# Patient Record
Sex: Male | Born: 1950
Health system: Southern US, Community
[De-identification: ages and names within clinical notes are randomized; demographics above are authoritative.]

## PROBLEM LIST (undated history)

## (undated) DIAGNOSIS — N1831 Chronic kidney disease, stage 3a: Secondary | ICD-10-CM

## (undated) DIAGNOSIS — D649 Anemia, unspecified: Secondary | ICD-10-CM

## (undated) DIAGNOSIS — G4730 Sleep apnea, unspecified: Secondary | ICD-10-CM

## (undated) DIAGNOSIS — J449 Chronic obstructive pulmonary disease, unspecified: Secondary | ICD-10-CM

## (undated) DIAGNOSIS — I6503 Occlusion and stenosis of bilateral vertebral arteries: Secondary | ICD-10-CM

## (undated) DIAGNOSIS — I6523 Occlusion and stenosis of bilateral carotid arteries: Secondary | ICD-10-CM

## (undated) DIAGNOSIS — I10 Essential (primary) hypertension: Secondary | ICD-10-CM

## (undated) DIAGNOSIS — I619 Nontraumatic intracerebral hemorrhage, unspecified: Secondary | ICD-10-CM

## (undated) DIAGNOSIS — I429 Cardiomyopathy, unspecified: Secondary | ICD-10-CM

## (undated) DIAGNOSIS — N200 Calculus of kidney: Secondary | ICD-10-CM

## (undated) DIAGNOSIS — Z87448 Personal history of other diseases of urinary system: Secondary | ICD-10-CM

## (undated) DIAGNOSIS — R5383 Other fatigue: Secondary | ICD-10-CM

## (undated) DIAGNOSIS — R5381 Other malaise: Secondary | ICD-10-CM

## (undated) DIAGNOSIS — E538 Deficiency of other specified B group vitamins: Secondary | ICD-10-CM

## (undated) DIAGNOSIS — Z8673 Personal history of transient ischemic attack (TIA), and cerebral infarction without residual deficits: Secondary | ICD-10-CM

## (undated) HISTORY — DX: Chronic kidney disease, stage 3a: N18.31

## (undated) HISTORY — DX: Chronic obstructive pulmonary disease, unspecified: J44.9

## (undated) HISTORY — DX: Nontraumatic intracerebral hemorrhage, unspecified: I61.9

## (undated) HISTORY — DX: Occlusion and stenosis of bilateral vertebral arteries: I65.03

## (undated) HISTORY — DX: Occlusion and stenosis of bilateral carotid arteries: I65.23

## (undated) HISTORY — DX: Cardiomyopathy, unspecified: I42.9

## (undated) HISTORY — DX: Other malaise: R53.81

## (undated) HISTORY — DX: Personal history of transient ischemic attack (TIA), and cerebral infarction without residual deficits: Z86.73

## (undated) HISTORY — DX: Essential (primary) hypertension: I10

## (undated) HISTORY — DX: Anemia, unspecified: D64.9

## (undated) HISTORY — DX: Personal history of other diseases of urinary system: Z87.448

## (undated) HISTORY — DX: Sleep apnea, unspecified: G47.30

## (undated) HISTORY — DX: Deficiency of other specified B group vitamins: E53.8

## (undated) HISTORY — DX: Other fatigue: R53.83

## (undated) HISTORY — DX: Calculus of kidney: N20.0

---

## 2016-03-30 DIAGNOSIS — F17210 Nicotine dependence, cigarettes, uncomplicated: Secondary | ICD-10-CM | POA: Diagnosis not present

## 2016-03-30 DIAGNOSIS — M79632 Pain in left forearm: Secondary | ICD-10-CM | POA: Diagnosis not present

## 2016-03-30 DIAGNOSIS — I10 Essential (primary) hypertension: Secondary | ICD-10-CM | POA: Diagnosis not present

## 2016-03-30 DIAGNOSIS — R0789 Other chest pain: Secondary | ICD-10-CM | POA: Diagnosis not present

## 2016-03-30 DIAGNOSIS — R079 Chest pain, unspecified: Secondary | ICD-10-CM | POA: Diagnosis not present

## 2016-03-31 DIAGNOSIS — M79632 Pain in left forearm: Secondary | ICD-10-CM | POA: Diagnosis not present

## 2016-05-13 DIAGNOSIS — I10 Essential (primary) hypertension: Secondary | ICD-10-CM | POA: Diagnosis not present

## 2016-05-13 DIAGNOSIS — M549 Dorsalgia, unspecified: Secondary | ICD-10-CM | POA: Diagnosis not present

## 2016-06-24 DIAGNOSIS — I10 Essential (primary) hypertension: Secondary | ICD-10-CM | POA: Diagnosis not present

## 2016-06-24 DIAGNOSIS — N2889 Other specified disorders of kidney and ureter: Secondary | ICD-10-CM | POA: Diagnosis not present

## 2016-07-11 DIAGNOSIS — N281 Cyst of kidney, acquired: Secondary | ICD-10-CM | POA: Diagnosis not present

## 2016-07-11 DIAGNOSIS — N2889 Other specified disorders of kidney and ureter: Secondary | ICD-10-CM | POA: Diagnosis not present

## 2016-12-22 DIAGNOSIS — M549 Dorsalgia, unspecified: Secondary | ICD-10-CM | POA: Diagnosis not present

## 2016-12-22 DIAGNOSIS — L723 Sebaceous cyst: Secondary | ICD-10-CM | POA: Diagnosis not present

## 2016-12-22 DIAGNOSIS — I10 Essential (primary) hypertension: Secondary | ICD-10-CM | POA: Diagnosis not present

## 2016-12-22 DIAGNOSIS — R21 Rash and other nonspecific skin eruption: Secondary | ICD-10-CM | POA: Diagnosis not present

## 2017-01-05 DIAGNOSIS — I10 Essential (primary) hypertension: Secondary | ICD-10-CM | POA: Diagnosis not present

## 2017-01-27 DIAGNOSIS — L720 Epidermal cyst: Secondary | ICD-10-CM | POA: Diagnosis not present

## 2017-02-04 DIAGNOSIS — I10 Essential (primary) hypertension: Secondary | ICD-10-CM | POA: Diagnosis not present

## 2017-02-25 DIAGNOSIS — I10 Essential (primary) hypertension: Secondary | ICD-10-CM | POA: Diagnosis not present

## 2017-02-25 DIAGNOSIS — R5383 Other fatigue: Secondary | ICD-10-CM | POA: Diagnosis not present

## 2017-02-25 DIAGNOSIS — Z79899 Other long term (current) drug therapy: Secondary | ICD-10-CM | POA: Diagnosis not present

## 2017-03-12 DIAGNOSIS — R079 Chest pain, unspecified: Secondary | ICD-10-CM | POA: Diagnosis not present

## 2017-03-12 DIAGNOSIS — I10 Essential (primary) hypertension: Secondary | ICD-10-CM | POA: Diagnosis not present

## 2017-03-19 DIAGNOSIS — I10 Essential (primary) hypertension: Secondary | ICD-10-CM | POA: Diagnosis not present

## 2017-03-19 DIAGNOSIS — Z79899 Other long term (current) drug therapy: Secondary | ICD-10-CM | POA: Diagnosis not present

## 2017-03-19 DIAGNOSIS — R5383 Other fatigue: Secondary | ICD-10-CM | POA: Diagnosis not present

## 2017-04-17 DIAGNOSIS — I10 Essential (primary) hypertension: Secondary | ICD-10-CM | POA: Diagnosis not present

## 2017-04-17 DIAGNOSIS — R001 Bradycardia, unspecified: Secondary | ICD-10-CM | POA: Diagnosis not present

## 2017-04-17 DIAGNOSIS — Z9114 Patient's other noncompliance with medication regimen: Secondary | ICD-10-CM | POA: Diagnosis not present

## 2017-05-01 DIAGNOSIS — I10 Essential (primary) hypertension: Secondary | ICD-10-CM | POA: Diagnosis not present

## 2017-07-01 DIAGNOSIS — F17210 Nicotine dependence, cigarettes, uncomplicated: Secondary | ICD-10-CM | POA: Diagnosis not present

## 2017-07-01 DIAGNOSIS — I10 Essential (primary) hypertension: Secondary | ICD-10-CM | POA: Diagnosis not present

## 2017-07-01 DIAGNOSIS — Z9114 Patient's other noncompliance with medication regimen: Secondary | ICD-10-CM | POA: Diagnosis not present

## 2017-07-15 DIAGNOSIS — Z79899 Other long term (current) drug therapy: Secondary | ICD-10-CM | POA: Diagnosis not present

## 2017-07-15 DIAGNOSIS — R062 Wheezing: Secondary | ICD-10-CM | POA: Diagnosis not present

## 2017-07-15 DIAGNOSIS — F17210 Nicotine dependence, cigarettes, uncomplicated: Secondary | ICD-10-CM | POA: Diagnosis not present

## 2017-07-15 DIAGNOSIS — Z9114 Patient's other noncompliance with medication regimen: Secondary | ICD-10-CM | POA: Diagnosis not present

## 2017-07-15 DIAGNOSIS — I10 Essential (primary) hypertension: Secondary | ICD-10-CM | POA: Diagnosis not present

## 2017-07-18 DIAGNOSIS — N201 Calculus of ureter: Secondary | ICD-10-CM | POA: Diagnosis not present

## 2017-07-18 DIAGNOSIS — R109 Unspecified abdominal pain: Secondary | ICD-10-CM | POA: Diagnosis not present

## 2017-07-18 DIAGNOSIS — Z87442 Personal history of urinary calculi: Secondary | ICD-10-CM | POA: Diagnosis not present

## 2017-07-20 DIAGNOSIS — N3001 Acute cystitis with hematuria: Secondary | ICD-10-CM | POA: Diagnosis not present

## 2017-07-20 DIAGNOSIS — N1330 Unspecified hydronephrosis: Secondary | ICD-10-CM | POA: Diagnosis not present

## 2017-07-20 DIAGNOSIS — R311 Benign essential microscopic hematuria: Secondary | ICD-10-CM | POA: Diagnosis not present

## 2017-07-20 DIAGNOSIS — N201 Calculus of ureter: Secondary | ICD-10-CM | POA: Diagnosis not present

## 2017-07-21 DIAGNOSIS — F17210 Nicotine dependence, cigarettes, uncomplicated: Secondary | ICD-10-CM | POA: Diagnosis not present

## 2017-07-21 DIAGNOSIS — N201 Calculus of ureter: Secondary | ICD-10-CM | POA: Diagnosis not present

## 2017-07-21 DIAGNOSIS — J449 Chronic obstructive pulmonary disease, unspecified: Secondary | ICD-10-CM | POA: Diagnosis not present

## 2017-07-21 DIAGNOSIS — N1330 Unspecified hydronephrosis: Secondary | ICD-10-CM | POA: Diagnosis not present

## 2017-07-21 DIAGNOSIS — R310 Gross hematuria: Secondary | ICD-10-CM | POA: Diagnosis not present

## 2017-07-21 DIAGNOSIS — I10 Essential (primary) hypertension: Secondary | ICD-10-CM | POA: Diagnosis not present

## 2017-07-22 DIAGNOSIS — N201 Calculus of ureter: Secondary | ICD-10-CM | POA: Diagnosis not present

## 2017-07-28 DIAGNOSIS — N201 Calculus of ureter: Secondary | ICD-10-CM | POA: Diagnosis not present

## 2017-07-28 DIAGNOSIS — N3020 Other chronic cystitis without hematuria: Secondary | ICD-10-CM | POA: Diagnosis not present

## 2017-08-12 DIAGNOSIS — Z9114 Patient's other noncompliance with medication regimen: Secondary | ICD-10-CM | POA: Diagnosis not present

## 2017-08-12 DIAGNOSIS — M549 Dorsalgia, unspecified: Secondary | ICD-10-CM | POA: Diagnosis not present

## 2017-08-12 DIAGNOSIS — I10 Essential (primary) hypertension: Secondary | ICD-10-CM | POA: Diagnosis not present

## 2017-08-12 DIAGNOSIS — F17210 Nicotine dependence, cigarettes, uncomplicated: Secondary | ICD-10-CM | POA: Diagnosis not present

## 2017-10-28 DIAGNOSIS — L300 Nummular dermatitis: Secondary | ICD-10-CM | POA: Diagnosis not present

## 2017-10-28 DIAGNOSIS — L299 Pruritus, unspecified: Secondary | ICD-10-CM | POA: Diagnosis not present

## 2017-10-28 DIAGNOSIS — L986 Other infiltrative disorders of the skin and subcutaneous tissue: Secondary | ICD-10-CM | POA: Diagnosis not present

## 2017-10-28 DIAGNOSIS — D485 Neoplasm of uncertain behavior of skin: Secondary | ICD-10-CM | POA: Diagnosis not present

## 2017-11-11 DIAGNOSIS — L986 Other infiltrative disorders of the skin and subcutaneous tissue: Secondary | ICD-10-CM | POA: Diagnosis not present

## 2017-11-13 DIAGNOSIS — F17210 Nicotine dependence, cigarettes, uncomplicated: Secondary | ICD-10-CM | POA: Diagnosis not present

## 2017-11-13 DIAGNOSIS — I10 Essential (primary) hypertension: Secondary | ICD-10-CM | POA: Diagnosis not present

## 2017-11-13 DIAGNOSIS — Z79899 Other long term (current) drug therapy: Secondary | ICD-10-CM | POA: Diagnosis not present

## 2017-11-23 ENCOUNTER — Ambulatory Visit (INDEPENDENT_AMBULATORY_CARE_PROVIDER_SITE_OTHER): Payer: Medicare HMO

## 2017-11-23 ENCOUNTER — Encounter: Payer: Self-pay | Admitting: Podiatry

## 2017-11-23 ENCOUNTER — Ambulatory Visit: Payer: Medicare HMO | Admitting: Podiatry

## 2017-11-23 VITALS — BP 143/67 | HR 61 | Ht 65.0 in | Wt 230.0 lb

## 2017-11-23 DIAGNOSIS — L97511 Non-pressure chronic ulcer of other part of right foot limited to breakdown of skin: Secondary | ICD-10-CM

## 2017-11-23 DIAGNOSIS — M79674 Pain in right toe(s): Secondary | ICD-10-CM

## 2017-11-27 DIAGNOSIS — R7989 Other specified abnormal findings of blood chemistry: Secondary | ICD-10-CM | POA: Diagnosis not present

## 2017-11-27 DIAGNOSIS — D47Z9 Other specified neoplasms of uncertain behavior of lymphoid, hematopoietic and related tissue: Secondary | ICD-10-CM | POA: Diagnosis not present

## 2017-11-27 DIAGNOSIS — R590 Localized enlarged lymph nodes: Secondary | ICD-10-CM | POA: Diagnosis not present

## 2017-11-30 ENCOUNTER — Encounter: Payer: Self-pay | Admitting: Podiatry

## 2017-11-30 ENCOUNTER — Ambulatory Visit: Payer: Medicare HMO | Admitting: Podiatry

## 2017-11-30 DIAGNOSIS — L986 Other infiltrative disorders of the skin and subcutaneous tissue: Secondary | ICD-10-CM | POA: Diagnosis not present

## 2017-11-30 DIAGNOSIS — L97511 Non-pressure chronic ulcer of other part of right foot limited to breakdown of skin: Secondary | ICD-10-CM | POA: Diagnosis not present

## 2017-11-30 NOTE — Progress Notes (Signed)
  Subjective:  Patient ID: Patrick Matthews, male    DOB: 08/11/1951,  MRN: [id number]  Chief Complaint  Patient presents with  . Wound Check    fu on right 1st toe wound is looking better still feels like there is something in it  Had consultation with cancer center and they cleared him no cancer    67 y.o. male returns for wound care. Believes the wound to be proving.  Was seen at the cancer center and cleared without further treatment. Denies N/V/F/Ch.  Objective:  There were no vitals filed for this visit. General AA&O x3. Normal mood and affect.  Vascular Foot warm to touch.  Neurologic Sensation grossly diminished.  Dermatologic (Wound) Wound Location: R hallux Wound Measurement: 0.5x0.5x0.1 Wound Base: Mixed Granular/Fibrotic Peri-wound: Normal Exudate: None: wound tissue dry  Wound progress: Improved since last check.  Orthopedic: No pain to palpation either foot.   Assessment & Plan:  Patient was evaluated and treated and all questions answered.  Skin lesion right hallux -Prior records reviewed.  Concern for atypical lymphoproliferative infiltrate.  States that findings could be worrisome for a T-cell lymphoma however PCR is negative.  Patient was seen by the wound care center which it did not recommend further treatment.  Ulcer R Hallux secondary to skin biopsy -Debridement as below. -Dressed with iodosorb, DSD.  Procedure: Selective Debridement of Wound Rationale: Removal of devitalized tissue from the wound to promote healing.  Pre-Debridement Wound Measurements: 0.5 cm x 0.5 cm x 0.1 cm  Post-Debridement Wound Measurements: same as pre-debridement. Type of Debridement: Selective Tissue Removed: Devitalized soft-tissue Instrumentation: 3-0 mm dermal curette Dressing: Dry, sterile, compression dressing. Disposition: Patient tolerated procedure well. Patient to return in 2 weeks for follow-up.Marland Kitchen  Return in about 2 weeks (around 12/14/2017) for Wound check.

## 2017-12-07 NOTE — Progress Notes (Signed)
  Subjective:  Patient ID: Patrick Matthews, male    DOB: 05-10-1951,  MRN: [id number]  Chief Complaint  Patient presents with  . Toe Pain    right great toe has felt like there is something in it went to dermatology the did biopsy and thought it was cancer then said it was not still having pain and still feels like there is something in it     67 y.o. male presents with the above complaint.  States that he had a biopsy performed of his right great toe about a month ago.  States that he feels that there is a dermatologist said the biopsy came back negative however out of concern he was sent to the cancer center for evaluation.  Here to discuss whether he should continue to go the cancer center.  No past medical history on file. No past surgical history on file.  Current Outpatient Medications:  .  lisinopril (PRINIVIL,ZESTRIL) 40 MG tablet, Take 40 mg by mouth daily., Disp: , Rfl:   No Known Allergies Review of Systems Objective:   Vitals:   11/23/17 1328  BP: (!) 143/67  Pulse: 61   General AA&O x3. Normal mood and affect.  Vascular Dorsalis pedis and posterior tibial pulses  present 1+ bilaterally  Capillary refill normal to all digits. Pedal hair growth normal.  Neurologic Epicritic sensation grossly present.  Dermatologic (Wound) Wound Location: R hallux Wound Measurement: 0.6x0.6 x0.1 Wound Base: Mixed Granular/Fibrotic Peri-wound: Normal Exudate: None: wound tissue dry   Orthopedic: MMT 5/5 in dorsiflexion, plantarflexion, inversion, and eversion. Normal lower extremity joint ROM without pain or crepitus.   Radiographs: Taken and reviewed. No osseous erosions present. No acute fractures or dislocations.  Assessment & Plan:  Patient was evaluated and treated and all questions answered.  Right first toe ulceration secondary to biopsy -Ulceration dressed with medihoney and DSD.  Patient educated on dressing changes -Advised to get further records for discussion  of whether he needs further therapy   Return in about 1 week (around 11/30/2017) for Wound check.

## 2017-12-10 ENCOUNTER — Other Ambulatory Visit: Payer: Self-pay | Admitting: Podiatry

## 2017-12-10 DIAGNOSIS — L97511 Non-pressure chronic ulcer of other part of right foot limited to breakdown of skin: Secondary | ICD-10-CM

## 2017-12-10 DIAGNOSIS — M79674 Pain in right toe(s): Secondary | ICD-10-CM

## 2017-12-14 ENCOUNTER — Ambulatory Visit: Payer: Medicare HMO | Admitting: Podiatry

## 2018-02-10 DIAGNOSIS — E7800 Pure hypercholesterolemia, unspecified: Secondary | ICD-10-CM | POA: Diagnosis not present

## 2018-02-10 DIAGNOSIS — R062 Wheezing: Secondary | ICD-10-CM | POA: Diagnosis not present

## 2018-02-10 DIAGNOSIS — Z79899 Other long term (current) drug therapy: Secondary | ICD-10-CM | POA: Diagnosis not present

## 2018-02-10 DIAGNOSIS — R5383 Other fatigue: Secondary | ICD-10-CM | POA: Diagnosis not present

## 2018-02-10 DIAGNOSIS — I10 Essential (primary) hypertension: Secondary | ICD-10-CM | POA: Diagnosis not present

## 2018-05-12 DIAGNOSIS — Z1389 Encounter for screening for other disorder: Secondary | ICD-10-CM | POA: Diagnosis not present

## 2018-05-12 DIAGNOSIS — I10 Essential (primary) hypertension: Secondary | ICD-10-CM | POA: Diagnosis not present

## 2018-05-12 DIAGNOSIS — Z0001 Encounter for general adult medical examination with abnormal findings: Secondary | ICD-10-CM | POA: Diagnosis not present

## 2018-05-12 DIAGNOSIS — Z125 Encounter for screening for malignant neoplasm of prostate: Secondary | ICD-10-CM | POA: Diagnosis not present

## 2018-05-12 DIAGNOSIS — Z1211 Encounter for screening for malignant neoplasm of colon: Secondary | ICD-10-CM | POA: Diagnosis not present

## 2018-05-12 DIAGNOSIS — Z1331 Encounter for screening for depression: Secondary | ICD-10-CM | POA: Diagnosis not present

## 2018-05-12 DIAGNOSIS — R350 Frequency of micturition: Secondary | ICD-10-CM | POA: Diagnosis not present

## 2018-05-12 DIAGNOSIS — F17210 Nicotine dependence, cigarettes, uncomplicated: Secondary | ICD-10-CM | POA: Diagnosis not present

## 2018-05-12 DIAGNOSIS — R5383 Other fatigue: Secondary | ICD-10-CM | POA: Diagnosis not present

## 2018-06-29 DIAGNOSIS — R7989 Other specified abnormal findings of blood chemistry: Secondary | ICD-10-CM | POA: Diagnosis not present

## 2018-07-12 DIAGNOSIS — Z1212 Encounter for screening for malignant neoplasm of rectum: Secondary | ICD-10-CM | POA: Diagnosis not present

## 2018-07-12 DIAGNOSIS — Z1211 Encounter for screening for malignant neoplasm of colon: Secondary | ICD-10-CM | POA: Diagnosis not present

## 2018-11-11 DIAGNOSIS — Z6836 Body mass index (BMI) 36.0-36.9, adult: Secondary | ICD-10-CM | POA: Diagnosis not present

## 2018-11-11 DIAGNOSIS — E7800 Pure hypercholesterolemia, unspecified: Secondary | ICD-10-CM | POA: Diagnosis not present

## 2018-11-11 DIAGNOSIS — I10 Essential (primary) hypertension: Secondary | ICD-10-CM | POA: Diagnosis not present

## 2018-11-11 DIAGNOSIS — F17210 Nicotine dependence, cigarettes, uncomplicated: Secondary | ICD-10-CM | POA: Diagnosis not present

## 2018-11-11 DIAGNOSIS — E669 Obesity, unspecified: Secondary | ICD-10-CM | POA: Diagnosis not present

## 2018-11-11 DIAGNOSIS — Z79899 Other long term (current) drug therapy: Secondary | ICD-10-CM | POA: Diagnosis not present

## 2018-11-11 DIAGNOSIS — M549 Dorsalgia, unspecified: Secondary | ICD-10-CM | POA: Diagnosis not present

## 2018-11-11 DIAGNOSIS — R7303 Prediabetes: Secondary | ICD-10-CM | POA: Diagnosis not present

## 2019-02-14 DIAGNOSIS — R29898 Other symptoms and signs involving the musculoskeletal system: Secondary | ICD-10-CM | POA: Diagnosis not present

## 2019-02-14 DIAGNOSIS — F17210 Nicotine dependence, cigarettes, uncomplicated: Secondary | ICD-10-CM | POA: Diagnosis not present

## 2019-02-14 DIAGNOSIS — E669 Obesity, unspecified: Secondary | ICD-10-CM | POA: Diagnosis not present

## 2019-02-14 DIAGNOSIS — R079 Chest pain, unspecified: Secondary | ICD-10-CM | POA: Diagnosis not present

## 2019-02-14 DIAGNOSIS — R42 Dizziness and giddiness: Secondary | ICD-10-CM | POA: Diagnosis not present

## 2019-02-14 DIAGNOSIS — Z6836 Body mass index (BMI) 36.0-36.9, adult: Secondary | ICD-10-CM | POA: Diagnosis not present

## 2019-02-15 DIAGNOSIS — R200 Anesthesia of skin: Secondary | ICD-10-CM | POA: Diagnosis not present

## 2019-02-15 DIAGNOSIS — R531 Weakness: Secondary | ICD-10-CM | POA: Diagnosis not present

## 2019-02-15 DIAGNOSIS — F17210 Nicotine dependence, cigarettes, uncomplicated: Secondary | ICD-10-CM | POA: Diagnosis not present

## 2019-02-15 DIAGNOSIS — R29898 Other symptoms and signs involving the musculoskeletal system: Secondary | ICD-10-CM | POA: Diagnosis not present

## 2019-02-15 DIAGNOSIS — R42 Dizziness and giddiness: Secondary | ICD-10-CM | POA: Diagnosis not present

## 2019-02-22 DIAGNOSIS — F17210 Nicotine dependence, cigarettes, uncomplicated: Secondary | ICD-10-CM | POA: Diagnosis not present

## 2019-02-22 DIAGNOSIS — I6523 Occlusion and stenosis of bilateral carotid arteries: Secondary | ICD-10-CM | POA: Diagnosis not present

## 2019-02-22 DIAGNOSIS — R42 Dizziness and giddiness: Secondary | ICD-10-CM | POA: Diagnosis not present

## 2019-02-22 DIAGNOSIS — R29898 Other symptoms and signs involving the musculoskeletal system: Secondary | ICD-10-CM | POA: Diagnosis not present

## 2019-02-25 DIAGNOSIS — I517 Cardiomegaly: Secondary | ICD-10-CM | POA: Diagnosis not present

## 2019-02-25 DIAGNOSIS — F17210 Nicotine dependence, cigarettes, uncomplicated: Secondary | ICD-10-CM | POA: Diagnosis not present

## 2019-02-25 DIAGNOSIS — J449 Chronic obstructive pulmonary disease, unspecified: Secondary | ICD-10-CM | POA: Diagnosis not present

## 2019-02-25 DIAGNOSIS — I639 Cerebral infarction, unspecified: Secondary | ICD-10-CM | POA: Diagnosis not present

## 2019-02-25 DIAGNOSIS — I252 Old myocardial infarction: Secondary | ICD-10-CM | POA: Diagnosis not present

## 2019-02-25 DIAGNOSIS — I6389 Other cerebral infarction: Secondary | ICD-10-CM | POA: Diagnosis not present

## 2019-02-25 DIAGNOSIS — Z792 Long term (current) use of antibiotics: Secondary | ICD-10-CM | POA: Diagnosis not present

## 2019-02-25 DIAGNOSIS — R079 Chest pain, unspecified: Secondary | ICD-10-CM | POA: Diagnosis not present

## 2019-02-25 DIAGNOSIS — I10 Essential (primary) hypertension: Secondary | ICD-10-CM | POA: Diagnosis not present

## 2019-03-01 ENCOUNTER — Ambulatory Visit (INDEPENDENT_AMBULATORY_CARE_PROVIDER_SITE_OTHER): Payer: Medicare HMO | Admitting: Neurology

## 2019-03-01 ENCOUNTER — Other Ambulatory Visit: Payer: Self-pay

## 2019-03-01 ENCOUNTER — Encounter: Payer: Self-pay | Admitting: Neurology

## 2019-03-01 DIAGNOSIS — I639 Cerebral infarction, unspecified: Secondary | ICD-10-CM | POA: Diagnosis not present

## 2019-03-01 DIAGNOSIS — I6381 Other cerebral infarction due to occlusion or stenosis of small artery: Secondary | ICD-10-CM

## 2019-03-01 NOTE — Progress Notes (Signed)
Virtual Visit via Video Note  I connected with Burnett Kanaris on 03/01/19 at  1:00 PM EDT by a video enabled telemedicine application and verified that I am speaking with the correct person using two identifiers.  This visit was performed using [email or web address] app for video and audio. The patient`s daughter was present throughout the visit and facilitated it location: Patient: at home with daughter Provider: at [facility] office Ref MD : Ernestene Kiel Reason for referral stroke   I discussed the limitations of evaluation and management by telemedicine and the availability of in person appointments. The patient expressed understanding and agreed to proceed.  History of Present Illness: Ms. Patrick Matthews is a pleasant 68 year old male seen today for virtual video consultation visit for stroke.  History is obtained from the patient and his daughter and I have personally reviewed imaging films in PACS.  He states he developed sudden onset of left hand weakness about a month ago.  This weakness lasted for a week he seems to have made near complete recovery.  He still has diminished fine motor skills in the left hand.  He denies any tingling numbness neck pain or radicular pain.  He denied slurred speech, headache, gait or balance problems.  He had a CT scan of the head performed at [facility] on 02/15/2019 which I have personally reviewed shows low density in the right frontoparietal cortex, right superior cerebellar cortex and left medial occipital lobes compatible with age indeterminate age infarcts.  Carotid ultrasound on 02/22/2019: Bilateral plaques but no significant stenosis at either carotid bifurcation.  He denies any history of atrial fibrillation or significant cardiac problems or prior strokes.  The patient does note an daytime sleepiness may be at risk for sleep apnea but has never been tested for it. Past medical history includes hypertension hyperlipidemia smoking and mild obesity.  Kidney stones  Family history mother diagnosed with stroke and father died of MI social history lives alone he smokes. Medication list aspirin 81 mg daily, lisinopril 40 mg daily, Lipitor 20 mg daily, ibuprofen 800 mg 3 times daily as needed.  Plavix 75 mg daily. 14 system review of systems is positive only for symptoms as stated above in history of present illness Observations/Objective: Physical neurological exam limited due to constraints of video visit.  Pleasant middle-aged mildly obese Caucasian male not in distress.  He is awake alert oriented to time place and person.  Speech and language appear normal.  Extraocular movements are full range without nystagmus.  Face appears symmetric with only subtle left nasolabial fold asymmetry when he smiles.  Tongue is midline.  Motor system exam no upper or lower extremity drift.  Slightly diminished fine finger movements on the left and 4 tapping on the left.  No focal weakness.  He walks with a fairly steady and slightly cautious gait.  He is able to stand on either foot unsupported and on his heels and toes. Assessment  : 68 year old Caucasian male with transient left leg weakness in April 2020 likely from embolic right MCA branch infarct.  CT scan also shows age-indeterminate right cerebellar and left occipital infarcts.  Etiology of stroke likely cardioembolic.  Vascular risk factors of hypertension, hyperlipidemia, smoking suspected sleep apnea and mild obesity.   Plan I had a long discussion with the patient and his daughter regarding his left leg weakness which likely is a result of thromboembolic right brain infarct  And the fact that he has 2 other silent strokes makes cardioembolic etiology quite likely.  I recommend further evaluation by checking echocardiogram and prolonged cardiac monitoring for paroxysmal A. fib.  Check lipid and A1c, MRI scan of the brain with MRA of the brain and neck.  Also polysomnogram for sleep apnea.  I have counseled the patient to  quit smoking and he agrees..  I also encouraged him to eat a healthy diet with lots of fruits, vegetables, cereals and whole grains and to be active and exercise regularly and lose weight.  Follow Up Instructions: Return for follow-up in 2 months  I discussed the assessment and treatment plan with the patient. The patient was provided an opportunity to ask questions and all were answered. The patient agreed with the plan and demonstrated an understanding of the instructions.   The patient was advised to call back or seek an in-person evaluation if the symptoms worsen or if the condition fails to improve as anticipated.  I provided 45 minutes of non-face-to-face time during this encounter.   Antony Contras, MD

## 2019-03-02 ENCOUNTER — Telehealth: Payer: Self-pay | Admitting: Neurology

## 2019-03-02 NOTE — Telephone Encounter (Signed)
Patrick Matthews: [id number] (exp. 03/02/19 to 05/31/19) order sent to GI. They will reach out to the patient to schedule.

## 2019-03-15 ENCOUNTER — Telehealth: Payer: Self-pay | Admitting: Neurology

## 2019-03-15 NOTE — Telephone Encounter (Signed)
Due to current COVID 19 pandemic, our office is severely reducing in office visits, in order to minimize the risk to our patients and healthcare providers.   Pt understands that although there may be some limitations with this type of visit, we will take all precautions to reduce any security or privacy concerns.  Pt understands that this will be treated like an in office visit and we will file with pt's insurance, and there may be a patient responsible charge related to this service.  Pt's email is [email or web address]. Pt will be using Doxy. Me for their virtual visit. Pt understands that the nurse will be calling to go over pt's chart. Pt was informed to measure neck size in inches prior to appointment with physician. Pt's daughter will be present to help the pt with virtual visit.

## 2019-03-21 NOTE — Telephone Encounter (Signed)
I called pt to update his chart prior to his appt. No answer, left a message asking him to call me back.

## 2019-03-21 NOTE — Telephone Encounter (Signed)
I called pt to update his chart. No answer, left a message asking him to call me back. 

## 2019-03-22 ENCOUNTER — Other Ambulatory Visit: Payer: Self-pay

## 2019-03-22 ENCOUNTER — Ambulatory Visit (INDEPENDENT_AMBULATORY_CARE_PROVIDER_SITE_OTHER): Payer: Medicare HMO | Admitting: Neurology

## 2019-03-22 ENCOUNTER — Encounter: Payer: Self-pay | Admitting: Neurology

## 2019-03-22 DIAGNOSIS — G4719 Other hypersomnia: Secondary | ICD-10-CM | POA: Diagnosis not present

## 2019-03-22 DIAGNOSIS — R0683 Snoring: Secondary | ICD-10-CM

## 2019-03-22 DIAGNOSIS — E669 Obesity, unspecified: Secondary | ICD-10-CM

## 2019-03-22 DIAGNOSIS — R351 Nocturia: Secondary | ICD-10-CM | POA: Diagnosis not present

## 2019-03-22 DIAGNOSIS — I6381 Other cerebral infarction due to occlusion or stenosis of small artery: Secondary | ICD-10-CM

## 2019-03-22 NOTE — Telephone Encounter (Signed)
I called pt.   Epworth Sleepiness Scale 0= would never doze 1= slight chance of dozing 2= moderate chance of dozing 3= high chance of dozing  Sitting and reading: 0 Watching TV: 1 Sitting inactive in a public place (ex. Theater or meeting): 0 As a passenger in a car for an hour without a break: 0 Lying down to rest in the afternoon: 1 Sitting and talking to someone: 0 Sitting quietly after lunch (no alcohol): 1 In a car, while stopped in traffic: 0 Total: 3  FSS: 54

## 2019-03-22 NOTE — Progress Notes (Signed)
Star Age, MD, PhD [facility] [address] [place], [place] [address]   Virtual Visit via Video Note on 03/22/2019:  I connected with Patrick Matthews on 03/22/19 at  4:00 PM EDT by a video enabled telemedicine application and verified that I am speaking with the correct person using two identifiers.   I discussed the limitations of evaluation and management by telemedicine and the availability of in person appointments. The patient expressed understanding and agreed to proceed.  History of Present Illness: Patrick Matthews is a 68 year old right-handed gentleman with an underlying medical history of hypertension, hyperlipidemia, lacunar stroke, smoking and obesity, who presents for a virtual, video based appointment via [email or web address] for evaluation of his sleep disorder, in particular, concern for underlying obstructive sleep apnea.  The patient is unaccompanied today and joins via cell phone from home, I am located in my office.  He is referred by Dr. Leonie Man and I reviewed his virtual visit note from 03/01/2019. He reports some weight gain, his self-reported weight about a month ago was 230 pounds.  His neck circumference by self-report is 18-1/2 inches.  His Epworth sleepiness score is 3 out of 24, fatigue severity score is 54 out of 63.  He is generally in bed between 10 and 11 PM, rise time is between 630 and 7.  He has significant nocturia about 3-4 times per average night.  He reports feeling improved since his lacunar stroke.  He never had a tonsillectomy.  He denies any family history of sleep apnea.  He admits to drinking caffeine all day long about 5 to 6 cups/day and does not drink water, very little by his estimation.  He has 2 dogs in the household, lives with his wife, daughter, granddaughter.  His Past Medical History Is Significant For: No past medical history on file.  His Past Surgical History Is Significant For: No past surgical history on  file.  His Family History Is Significant For: No family history on file.  His Social History Is Significant For: Social History   Socioeconomic History  . Marital status: Legally Separated    Spouse name: Not on file  . Number of children: Not on file  . Years of education: Not on file  . Highest education level: Not on file  Occupational History  . Not on file  Social Needs  . Financial resource strain: Not on file  . Food insecurity:    Worry: Not on file    Inability: Not on file  . Transportation needs:    Medical: Not on file    Non-medical: Not on file  Tobacco Use  . Smoking status: Current Every Day Smoker    Packs/day: 1.00  . Smokeless tobacco: Never Used  Substance and Sexual Activity  . Alcohol use: Not on file  . Drug use: Not on file  . Sexual activity: Not on file  Lifestyle  . Physical activity:    Days per week: Not on file    Minutes per session: Not on file  . Stress: Not on file  Relationships  . Social connections:    Talks on phone: Not on file    Gets together: Not on file    Attends religious service: Not on file    Active member of club or organization: Not on file    Attends meetings of clubs or organizations: Not on file    Relationship status: Not on file  Other Topics Concern  .  Not on file  Social History Narrative  . Not on file    His Allergies Are:  No Known Allergies:   His Current Medications Are:  Outpatient Encounter Medications as of 03/22/2019  Medication Sig  . lisinopril (PRINIVIL,ZESTRIL) 40 MG tablet Take 40 mg by mouth daily.   No facility-administered encounter medications on file as of 03/22/2019.   :   Review of Systems:  Out of a complete 14 point review of systems, all are reviewed and negative with the exception of these symptoms as listed below:  Observations/Objective: On examination, he is pleasant and conversant, in no acute distress.  Speech is edentulous sounding but not dysarthric.  Airway  examination reveals a moderately crowded airway secondary to thicker soft palate and thicker tongue.  He has edentulous state and the top with the exception of one tooth perhaps some several missing teeth in the lower jaw.  Hearing is grossly intact, face is symmetric with normal appearing facial animation. Upper body mobility and coordination are grossly intact.  Assessment and Plan: Patrick Matthews is a 68 year old right-handed gentleman with an underlying medical history of hypertension, hyperlipidemia, lacunar stroke, smoking and obesity, who presents for a virtual, video based appointment via [email or web address] for evaluation of his sleep disorder, in particular, concern for underlying obstructive sleep apnea. The patient's medical history and physical exam (albeit limited with current video-based evaluation) are concerning for a diagnosis of obstructive sleep apnea. I discussed with the patient the diagnosis of OSA, its prognosis and treatment options. I explained in particular the risks and ramifications of untreated moderate to severe OSA, especially with respect to developing cardiovascular disease and increase in stroke risk. I recommended the following at this time: sleep study. I explained the sleep test procedure to the patient and also outlined the CPAP treatment option. He indicated that he would be willing to try CPAP if the need arises. I answered all his questions today and the patient was in agreement. I plan to see the patient back after the sleep study is completed and encouraged him to call with any interim questions, concerns, problems or updates.   Star Age, MD, PhD    Follow Up Instructions:    I discussed the assessment and treatment plan with the patient. The patient was provided an opportunity to ask questions and all were answered. The patient agreed with the plan and demonstrated an understanding of the instructions.   The patient was advised to call back or seek an in-person  evaluation if the symptoms worsen or if the condition fails to improve as anticipated.  I provided 25 minutes of non-face-to-face time during this encounter.   Star Age, MD

## 2019-03-22 NOTE — Telephone Encounter (Signed)
I called pt again to update his chart prior to the virtual visit. No answer, left a message asking him to call us back.

## 2019-03-31 ENCOUNTER — Other Ambulatory Visit: Payer: Self-pay | Admitting: *Deleted

## 2019-03-31 DIAGNOSIS — I639 Cerebral infarction, unspecified: Secondary | ICD-10-CM

## 2019-04-10 DIAGNOSIS — I639 Cerebral infarction, unspecified: Secondary | ICD-10-CM | POA: Diagnosis not present

## 2019-04-12 ENCOUNTER — Other Ambulatory Visit (INDEPENDENT_AMBULATORY_CARE_PROVIDER_SITE_OTHER): Payer: Medicare HMO

## 2019-04-12 DIAGNOSIS — I639 Cerebral infarction, unspecified: Secondary | ICD-10-CM | POA: Diagnosis not present

## 2019-04-20 ENCOUNTER — Other Ambulatory Visit: Payer: Self-pay

## 2019-04-20 ENCOUNTER — Ambulatory Visit (INDEPENDENT_AMBULATORY_CARE_PROVIDER_SITE_OTHER): Payer: Medicare HMO | Admitting: Neurology

## 2019-04-20 DIAGNOSIS — R351 Nocturia: Secondary | ICD-10-CM

## 2019-04-20 DIAGNOSIS — I6381 Other cerebral infarction due to occlusion or stenosis of small artery: Secondary | ICD-10-CM

## 2019-04-20 DIAGNOSIS — E669 Obesity, unspecified: Secondary | ICD-10-CM

## 2019-04-20 DIAGNOSIS — G4719 Other hypersomnia: Secondary | ICD-10-CM

## 2019-04-20 DIAGNOSIS — R0683 Snoring: Secondary | ICD-10-CM

## 2019-04-20 DIAGNOSIS — G4733 Obstructive sleep apnea (adult) (pediatric): Secondary | ICD-10-CM | POA: Diagnosis not present

## 2019-04-27 ENCOUNTER — Telehealth: Payer: Self-pay

## 2019-04-27 NOTE — Telephone Encounter (Signed)
-----   Message from Star Age, MD sent at 04/27/2019  8:43 AM EDT ----- Patient referred by Dr. Leonie Man, seen by me on 03/22/19 in VV, HST on 04/20/19:  Please call and notify the patient that the recent home sleep test did suggest the diagnosis of severe obstructive sleep apnea and that I recommend treatment for this in the form of CPAP. I will request an overnight sleep study for proper titration and mask fitting. Please explain to patient and arrange for a CPAP titration study. I have placed an order in the chart. She sleep lab should call him soon.  Star Age, MD, PhD [facility])

## 2019-04-27 NOTE — Procedures (Signed)
Patient Information     First Name: Patrick Last Name: Matthews ID: [id number]  Birth Date: 1951-05-23 Age: 68 Gender: Male  Referring Provider: Ernestene Kiel, MD BMI: 38.2 (W=229 lb, H=5' 5'')  Neck Circ.:  18 '' Epworth:  3/24   Sleep Study Information    Study Date: Apr 20, 2019 S/H/A Version: 001.001.001.001 / 4.1.1528 / 72  History:      68 year old right-handed gentleman with an underlying medical history of hypertension, hyperlipidemia, lacunar stroke, smoking and obesity, who reports sleep sleep disruption from nocturia.   Summary & Diagnosis:     OSA   Recommendations:      This home sleep test demonstrates severe obstructive sleep apnea with a total AHI of 42.7/hour and O2 nadir of 82%. Treatment with positive airway pressure (PAP) - in the form of CPAP - is recommended. This will require, ideally, a full night CPAP titration study for proper treatment settings, O2 monitoring and mask fitting. Based on the severity of the sleep disordered breathing, an attended titration study is indicated. Please note, that untreated obstructive sleep apnea may carry additional perioperative morbidity. Patients with significant obstructive sleep apnea should receive perioperative PAP therapy and the surgeons and particularly the anesthesiologist should be informed of the diagnosis and the severity of the sleep disordered breathing. The patient should be cautioned not to drive, work at heights, or operate dangerous or heavy equipment when tired or sleepy. Review and reiteration of good sleep hygiene measures should be pursued with any patient. Other causes of the patient's symptoms, including circadian rhythm disturbances, an underlying mood disorder, medication effect and/or an underlying medical problem cannot be ruled out based on this test. Clinical correlation is recommended. The patient and his referring provider will be notified of the test results. The patient will be seen in follow up in  sleep clinic at [facility], either for a face-to-face or virtual visit, whichever feasible and recommended at the time.  I certify that I have reviewed the raw data recording prior to the issuance of this report in accordance with the standards of the American Academy of Sleep Medicine (AASM).  Star Age, MD, PhD [facility]) Diplomat, ABPN (Neurology and Sleep)             Sleep Summary  Oxygen Saturation Statistics   Start Study Time: End Study Time: Total Recording Time:  10:25:26 PM   6:42:48 AM   8 h, 17 min  Total Sleep Time % REM of Sleep Time:  6 h, 50 min  17.6    Mean: 92 Minimum: 82 Maximum: 97  Mean of Desaturations Nadirs (%):   90  Oxygen Desaturation. %:   4-9 10-20 >20 Total  Events Number Total    56  1 98.2 1.8  0 0.0  57 100.0  Oxygen Saturation: <90 <=88 <85 <80 <70  Duration (minutes): Sleep % 3.0 0.7  0.4 0.0  0.1 0.0 0.0 0.0 0.0 0.0     Respiratory Indices      Total Events REM NREM All Night  pRDI:  179  pAHI:  178 ODI:  57  pAHIc:  6  % CSR: 0.0 54.7 53.3 33.7 0.0 40.5 40.5 9.6 1.8 42.9 42.7 13.7 1.5       Pulse Rate Statistics during Sleep (BPM)      Mean: 56 Minimum: N/A Maximum:  84    Indices are calculated using technically valid sleep time of  4 hrs, 10 min.  Central-Indices are calculated using technically valid sleep time of  4  hrs, 3 min. pRDI/pAHI are calculated using oxi desaturations ? 3%  Body Position Statistics  Position Supine Prone Right Left Non-Supine  Sleep (min) 262.0 1.0 64.5 83.4 148.9  Sleep % 63.8 0.2 15.7 20.3 36.2  pRDI 42.2 N/A 55.2 39.1 44.9  pAHI 41.9 N/A 55.2 39.1 44.9  ODI 15.3 N/A 21.6 2.7 9.5     Snoring Statistics Snoring Level (dB) >40 >50 >60 >70 >80 >Threshold (45)  Sleep (min) 240.3 8.8 1.2 0.5 0.0 31.1  Sleep % 58.5 2.1 0.3 0.1 0.0 7.6    Mean: 42 dB Sleep Stages Chart                                                                 pAHI=42.7                                                                              Mild              Moderate                    Severe                                                 5              15                    30

## 2019-04-27 NOTE — Addendum Note (Signed)
Addended by: Star Age on: 04/27/2019 08:43 AM   Modules accepted: Orders

## 2019-04-27 NOTE — Progress Notes (Signed)
Patient referred by Dr. Leonie Man, seen by me on 03/22/19 in [place], [place] on 04/20/19:  Please call and notify the patient that the recent home sleep test did suggest the diagnosis of severe obstructive sleep apnea and that I recommend treatment for this in the form of CPAP. I will request an overnight sleep study for proper titration and mask fitting. Please explain to patient and arrange for a CPAP titration study. I have placed an order in the chart. She sleep lab should call him soon.  Star Age, MD, PhD [facility])

## 2019-04-27 NOTE — Telephone Encounter (Signed)
I called pt to discuss his sleep study results. No answer, left a message asking him to call me back. 

## 2019-04-28 NOTE — Telephone Encounter (Signed)
I called pt again to discuss his sleep study results. No answer, left a message asking her to call me back.

## 2019-05-02 NOTE — Telephone Encounter (Signed)
I called pt again to discuss. No answer, left a message asking him to call me back. This is my third unsuccessful attempt at reaching pt by phone. Will send him a letter asking him to call me back.

## 2019-05-12 DIAGNOSIS — Z6837 Body mass index (BMI) 37.0-37.9, adult: Secondary | ICD-10-CM | POA: Diagnosis not present

## 2019-05-12 DIAGNOSIS — I639 Cerebral infarction, unspecified: Secondary | ICD-10-CM | POA: Diagnosis not present

## 2019-05-12 DIAGNOSIS — I10 Essential (primary) hypertension: Secondary | ICD-10-CM | POA: Diagnosis not present

## 2019-05-12 DIAGNOSIS — E7800 Pure hypercholesterolemia, unspecified: Secondary | ICD-10-CM | POA: Diagnosis not present

## 2019-05-12 DIAGNOSIS — R062 Wheezing: Secondary | ICD-10-CM | POA: Diagnosis not present

## 2019-05-12 DIAGNOSIS — R350 Frequency of micturition: Secondary | ICD-10-CM | POA: Diagnosis not present

## 2019-05-12 DIAGNOSIS — F17210 Nicotine dependence, cigarettes, uncomplicated: Secondary | ICD-10-CM | POA: Diagnosis not present

## 2019-05-12 DIAGNOSIS — E669 Obesity, unspecified: Secondary | ICD-10-CM | POA: Diagnosis not present

## 2019-05-12 DIAGNOSIS — Z79899 Other long term (current) drug therapy: Secondary | ICD-10-CM | POA: Diagnosis not present

## 2019-06-09 DIAGNOSIS — R350 Frequency of micturition: Secondary | ICD-10-CM | POA: Diagnosis not present

## 2019-06-09 DIAGNOSIS — I10 Essential (primary) hypertension: Secondary | ICD-10-CM | POA: Diagnosis not present

## 2019-06-28 ENCOUNTER — Telehealth: Payer: Self-pay

## 2019-06-28 NOTE — Telephone Encounter (Signed)
We have attempted to call the patient an additional two more times to schedule sleep study.  Patient has been unavailable at the phone numbers we have on file and has not returned our calls.  If patient calls back we will schedule them for their sleep study.

## 2019-09-06 DIAGNOSIS — R519 Headache, unspecified: Secondary | ICD-10-CM | POA: Diagnosis not present

## 2019-09-06 DIAGNOSIS — I160 Hypertensive urgency: Secondary | ICD-10-CM | POA: Diagnosis not present

## 2019-09-06 DIAGNOSIS — M545 Low back pain: Secondary | ICD-10-CM | POA: Diagnosis not present

## 2019-09-06 DIAGNOSIS — R079 Chest pain, unspecified: Secondary | ICD-10-CM | POA: Diagnosis not present

## 2019-09-23 DIAGNOSIS — H25813 Combined forms of age-related cataract, bilateral: Secondary | ICD-10-CM | POA: Diagnosis not present

## 2019-09-27 DIAGNOSIS — Z79899 Other long term (current) drug therapy: Secondary | ICD-10-CM | POA: Diagnosis not present

## 2019-09-27 DIAGNOSIS — I10 Essential (primary) hypertension: Secondary | ICD-10-CM | POA: Diagnosis not present

## 2019-09-27 DIAGNOSIS — E7800 Pure hypercholesterolemia, unspecified: Secondary | ICD-10-CM | POA: Diagnosis not present

## 2019-10-13 DIAGNOSIS — I10 Essential (primary) hypertension: Secondary | ICD-10-CM | POA: Diagnosis not present

## 2019-10-17 DIAGNOSIS — M545 Low back pain: Secondary | ICD-10-CM | POA: Diagnosis not present

## 2019-12-19 DIAGNOSIS — H2511 Age-related nuclear cataract, right eye: Secondary | ICD-10-CM | POA: Diagnosis not present

## 2019-12-19 DIAGNOSIS — Z01818 Encounter for other preprocedural examination: Secondary | ICD-10-CM | POA: Diagnosis not present

## 2019-12-20 DIAGNOSIS — I10 Essential (primary) hypertension: Secondary | ICD-10-CM | POA: Diagnosis not present

## 2019-12-28 DIAGNOSIS — E7800 Pure hypercholesterolemia, unspecified: Secondary | ICD-10-CM | POA: Diagnosis not present

## 2019-12-28 DIAGNOSIS — Z79899 Other long term (current) drug therapy: Secondary | ICD-10-CM | POA: Diagnosis not present

## 2019-12-28 DIAGNOSIS — I10 Essential (primary) hypertension: Secondary | ICD-10-CM | POA: Diagnosis not present

## 2020-01-25 DIAGNOSIS — Z7189 Other specified counseling: Secondary | ICD-10-CM | POA: Diagnosis not present

## 2020-01-25 DIAGNOSIS — Z0001 Encounter for general adult medical examination with abnormal findings: Secondary | ICD-10-CM | POA: Diagnosis not present

## 2020-02-16 DIAGNOSIS — J449 Chronic obstructive pulmonary disease, unspecified: Secondary | ICD-10-CM | POA: Diagnosis not present

## 2020-02-16 DIAGNOSIS — I10 Essential (primary) hypertension: Secondary | ICD-10-CM | POA: Diagnosis not present

## 2020-02-16 DIAGNOSIS — E782 Mixed hyperlipidemia: Secondary | ICD-10-CM | POA: Diagnosis not present

## 2020-02-16 DIAGNOSIS — R0600 Dyspnea, unspecified: Secondary | ICD-10-CM | POA: Diagnosis not present

## 2020-02-16 DIAGNOSIS — Z8673 Personal history of transient ischemic attack (TIA), and cerebral infarction without residual deficits: Secondary | ICD-10-CM | POA: Diagnosis not present

## 2020-02-16 DIAGNOSIS — R5383 Other fatigue: Secondary | ICD-10-CM | POA: Diagnosis not present

## 2020-02-16 DIAGNOSIS — R5381 Other malaise: Secondary | ICD-10-CM | POA: Diagnosis not present

## 2020-02-16 DIAGNOSIS — M5136 Other intervertebral disc degeneration, lumbar region: Secondary | ICD-10-CM | POA: Diagnosis not present

## 2020-02-16 DIAGNOSIS — R0602 Shortness of breath: Secondary | ICD-10-CM | POA: Diagnosis not present

## 2020-02-16 DIAGNOSIS — Z0389 Encounter for observation for other suspected diseases and conditions ruled out: Secondary | ICD-10-CM | POA: Diagnosis not present

## 2020-03-08 DIAGNOSIS — J449 Chronic obstructive pulmonary disease, unspecified: Secondary | ICD-10-CM | POA: Diagnosis not present

## 2020-03-08 DIAGNOSIS — I10 Essential (primary) hypertension: Secondary | ICD-10-CM | POA: Diagnosis not present

## 2020-03-08 DIAGNOSIS — G4730 Sleep apnea, unspecified: Secondary | ICD-10-CM | POA: Diagnosis not present

## 2020-03-15 DIAGNOSIS — Z7902 Long term (current) use of antithrombotics/antiplatelets: Secondary | ICD-10-CM | POA: Diagnosis not present

## 2020-03-15 DIAGNOSIS — Z79899 Other long term (current) drug therapy: Secondary | ICD-10-CM | POA: Diagnosis not present

## 2020-03-15 DIAGNOSIS — J9811 Atelectasis: Secondary | ICD-10-CM | POA: Diagnosis not present

## 2020-03-15 DIAGNOSIS — I63233 Cerebral infarction due to unspecified occlusion or stenosis of bilateral carotid arteries: Secondary | ICD-10-CM | POA: Diagnosis not present

## 2020-03-15 DIAGNOSIS — J449 Chronic obstructive pulmonary disease, unspecified: Secondary | ICD-10-CM | POA: Diagnosis not present

## 2020-03-15 DIAGNOSIS — R519 Headache, unspecified: Secondary | ICD-10-CM | POA: Diagnosis not present

## 2020-03-15 DIAGNOSIS — R29702 NIHSS score 2: Secondary | ICD-10-CM | POA: Diagnosis not present

## 2020-03-15 DIAGNOSIS — I619 Nontraumatic intracerebral hemorrhage, unspecified: Secondary | ICD-10-CM | POA: Diagnosis not present

## 2020-03-15 DIAGNOSIS — Z8673 Personal history of transient ischemic attack (TIA), and cerebral infarction without residual deficits: Secondary | ICD-10-CM | POA: Diagnosis not present

## 2020-03-15 DIAGNOSIS — I63239 Cerebral infarction due to unspecified occlusion or stenosis of unspecified carotid arteries: Secondary | ICD-10-CM | POA: Diagnosis not present

## 2020-03-15 DIAGNOSIS — I10 Essential (primary) hypertension: Secondary | ICD-10-CM | POA: Diagnosis not present

## 2020-03-15 DIAGNOSIS — R42 Dizziness and giddiness: Secondary | ICD-10-CM | POA: Diagnosis not present

## 2020-03-15 DIAGNOSIS — I639 Cerebral infarction, unspecified: Secondary | ICD-10-CM | POA: Diagnosis not present

## 2020-03-17 DIAGNOSIS — I6503 Occlusion and stenosis of bilateral vertebral arteries: Secondary | ICD-10-CM | POA: Diagnosis not present

## 2020-03-17 DIAGNOSIS — E785 Hyperlipidemia, unspecified: Secondary | ICD-10-CM | POA: Diagnosis not present

## 2020-03-17 DIAGNOSIS — Z7902 Long term (current) use of antithrombotics/antiplatelets: Secondary | ICD-10-CM | POA: Diagnosis not present

## 2020-03-17 DIAGNOSIS — Z87442 Personal history of urinary calculi: Secondary | ICD-10-CM | POA: Diagnosis not present

## 2020-03-17 DIAGNOSIS — I672 Cerebral atherosclerosis: Secondary | ICD-10-CM | POA: Diagnosis not present

## 2020-03-17 DIAGNOSIS — Z7951 Long term (current) use of inhaled steroids: Secondary | ICD-10-CM | POA: Diagnosis not present

## 2020-03-17 DIAGNOSIS — I6389 Other cerebral infarction: Secondary | ICD-10-CM | POA: Diagnosis not present

## 2020-03-17 DIAGNOSIS — N189 Chronic kidney disease, unspecified: Secondary | ICD-10-CM | POA: Diagnosis not present

## 2020-03-17 DIAGNOSIS — I6502 Occlusion and stenosis of left vertebral artery: Secondary | ICD-10-CM | POA: Diagnosis not present

## 2020-03-17 DIAGNOSIS — I252 Old myocardial infarction: Secondary | ICD-10-CM | POA: Diagnosis not present

## 2020-03-17 DIAGNOSIS — I63211 Cerebral infarction due to unspecified occlusion or stenosis of right vertebral arteries: Secondary | ICD-10-CM | POA: Diagnosis not present

## 2020-03-17 DIAGNOSIS — I6501 Occlusion and stenosis of right vertebral artery: Secondary | ICD-10-CM | POA: Diagnosis not present

## 2020-03-17 DIAGNOSIS — Z743 Need for continuous supervision: Secondary | ICD-10-CM | POA: Diagnosis not present

## 2020-03-17 DIAGNOSIS — I69354 Hemiplegia and hemiparesis following cerebral infarction affecting left non-dominant side: Secondary | ICD-10-CM | POA: Diagnosis not present

## 2020-03-17 DIAGNOSIS — I10 Essential (primary) hypertension: Secondary | ICD-10-CM | POA: Diagnosis not present

## 2020-03-17 DIAGNOSIS — I129 Hypertensive chronic kidney disease with stage 1 through stage 4 chronic kidney disease, or unspecified chronic kidney disease: Secondary | ICD-10-CM | POA: Diagnosis not present

## 2020-03-17 DIAGNOSIS — I639 Cerebral infarction, unspecified: Secondary | ICD-10-CM | POA: Diagnosis not present

## 2020-03-17 DIAGNOSIS — R519 Headache, unspecified: Secondary | ICD-10-CM | POA: Diagnosis not present

## 2020-03-17 DIAGNOSIS — J449 Chronic obstructive pulmonary disease, unspecified: Secondary | ICD-10-CM | POA: Diagnosis not present

## 2020-03-17 DIAGNOSIS — E1122 Type 2 diabetes mellitus with diabetic chronic kidney disease: Secondary | ICD-10-CM | POA: Diagnosis not present

## 2020-03-17 DIAGNOSIS — R42 Dizziness and giddiness: Secondary | ICD-10-CM | POA: Diagnosis not present

## 2020-03-17 DIAGNOSIS — Z7982 Long term (current) use of aspirin: Secondary | ICD-10-CM | POA: Diagnosis not present

## 2020-03-17 DIAGNOSIS — I517 Cardiomegaly: Secondary | ICD-10-CM | POA: Diagnosis not present

## 2020-03-17 DIAGNOSIS — I6523 Occlusion and stenosis of bilateral carotid arteries: Secondary | ICD-10-CM | POA: Diagnosis not present

## 2020-03-17 DIAGNOSIS — I2510 Atherosclerotic heart disease of native coronary artery without angina pectoris: Secondary | ICD-10-CM | POA: Diagnosis not present

## 2020-03-17 DIAGNOSIS — Z79899 Other long term (current) drug therapy: Secondary | ICD-10-CM | POA: Diagnosis not present

## 2020-03-26 DIAGNOSIS — E782 Mixed hyperlipidemia: Secondary | ICD-10-CM | POA: Diagnosis not present

## 2020-03-26 DIAGNOSIS — I6503 Occlusion and stenosis of bilateral vertebral arteries: Secondary | ICD-10-CM | POA: Diagnosis not present

## 2020-03-26 DIAGNOSIS — I619 Nontraumatic intracerebral hemorrhage, unspecified: Secondary | ICD-10-CM | POA: Diagnosis not present

## 2020-03-26 DIAGNOSIS — I10 Essential (primary) hypertension: Secondary | ICD-10-CM | POA: Diagnosis not present

## 2020-03-26 DIAGNOSIS — I6523 Occlusion and stenosis of bilateral carotid arteries: Secondary | ICD-10-CM | POA: Diagnosis not present

## 2020-04-12 DIAGNOSIS — I679 Cerebrovascular disease, unspecified: Secondary | ICD-10-CM | POA: Diagnosis not present

## 2020-04-12 DIAGNOSIS — I6509 Occlusion and stenosis of unspecified vertebral artery: Secondary | ICD-10-CM | POA: Diagnosis not present

## 2020-04-26 DIAGNOSIS — G4733 Obstructive sleep apnea (adult) (pediatric): Secondary | ICD-10-CM | POA: Diagnosis not present

## 2020-04-26 DIAGNOSIS — J449 Chronic obstructive pulmonary disease, unspecified: Secondary | ICD-10-CM | POA: Diagnosis not present

## 2020-04-26 DIAGNOSIS — R5383 Other fatigue: Secondary | ICD-10-CM | POA: Diagnosis not present

## 2020-05-15 DIAGNOSIS — I6389 Other cerebral infarction: Secondary | ICD-10-CM | POA: Diagnosis not present

## 2020-05-20 DIAGNOSIS — Z711 Person with feared health complaint in whom no diagnosis is made: Secondary | ICD-10-CM | POA: Diagnosis not present

## 2020-05-20 DIAGNOSIS — G4733 Obstructive sleep apnea (adult) (pediatric): Secondary | ICD-10-CM | POA: Diagnosis not present

## 2020-05-20 DIAGNOSIS — R0683 Snoring: Secondary | ICD-10-CM | POA: Diagnosis not present

## 2020-05-24 DIAGNOSIS — R5383 Other fatigue: Secondary | ICD-10-CM | POA: Diagnosis not present

## 2020-05-24 DIAGNOSIS — G4733 Obstructive sleep apnea (adult) (pediatric): Secondary | ICD-10-CM | POA: Diagnosis not present

## 2020-05-24 DIAGNOSIS — J449 Chronic obstructive pulmonary disease, unspecified: Secondary | ICD-10-CM | POA: Diagnosis not present

## 2020-06-01 DIAGNOSIS — J449 Chronic obstructive pulmonary disease, unspecified: Secondary | ICD-10-CM | POA: Diagnosis not present

## 2020-06-07 DIAGNOSIS — I6389 Other cerebral infarction: Secondary | ICD-10-CM | POA: Diagnosis not present

## 2020-06-08 DIAGNOSIS — I493 Ventricular premature depolarization: Secondary | ICD-10-CM | POA: Diagnosis not present

## 2020-06-08 DIAGNOSIS — I471 Supraventricular tachycardia: Secondary | ICD-10-CM | POA: Diagnosis not present

## 2020-06-08 DIAGNOSIS — I491 Atrial premature depolarization: Secondary | ICD-10-CM | POA: Diagnosis not present

## 2020-06-11 DIAGNOSIS — E782 Mixed hyperlipidemia: Secondary | ICD-10-CM | POA: Diagnosis not present

## 2020-06-11 DIAGNOSIS — I10 Essential (primary) hypertension: Secondary | ICD-10-CM | POA: Diagnosis not present

## 2020-06-11 DIAGNOSIS — I619 Nontraumatic intracerebral hemorrhage, unspecified: Secondary | ICD-10-CM | POA: Diagnosis not present

## 2020-06-11 DIAGNOSIS — I6523 Occlusion and stenosis of bilateral carotid arteries: Secondary | ICD-10-CM | POA: Diagnosis not present

## 2020-06-11 DIAGNOSIS — E538 Deficiency of other specified B group vitamins: Secondary | ICD-10-CM | POA: Diagnosis not present

## 2020-06-11 DIAGNOSIS — I6503 Occlusion and stenosis of bilateral vertebral arteries: Secondary | ICD-10-CM | POA: Diagnosis not present

## 2020-07-02 DIAGNOSIS — J449 Chronic obstructive pulmonary disease, unspecified: Secondary | ICD-10-CM | POA: Diagnosis not present

## 2020-08-01 DIAGNOSIS — J449 Chronic obstructive pulmonary disease, unspecified: Secondary | ICD-10-CM | POA: Diagnosis not present

## 2020-09-01 DIAGNOSIS — J449 Chronic obstructive pulmonary disease, unspecified: Secondary | ICD-10-CM | POA: Diagnosis not present

## 2020-09-28 DIAGNOSIS — I669 Occlusion and stenosis of unspecified cerebral artery: Secondary | ICD-10-CM | POA: Diagnosis not present

## 2020-09-28 DIAGNOSIS — Z7982 Long term (current) use of aspirin: Secondary | ICD-10-CM | POA: Diagnosis not present

## 2020-09-28 DIAGNOSIS — I6389 Other cerebral infarction: Secondary | ICD-10-CM | POA: Diagnosis not present

## 2020-09-28 DIAGNOSIS — Z8673 Personal history of transient ischemic attack (TIA), and cerebral infarction without residual deficits: Secondary | ICD-10-CM | POA: Diagnosis not present

## 2020-09-28 DIAGNOSIS — Z79899 Other long term (current) drug therapy: Secondary | ICD-10-CM | POA: Diagnosis not present

## 2020-10-01 DIAGNOSIS — J449 Chronic obstructive pulmonary disease, unspecified: Secondary | ICD-10-CM | POA: Diagnosis not present

## 2020-10-08 DIAGNOSIS — R6889 Other general symptoms and signs: Secondary | ICD-10-CM | POA: Diagnosis not present

## 2020-10-08 DIAGNOSIS — N3000 Acute cystitis without hematuria: Secondary | ICD-10-CM | POA: Diagnosis not present

## 2020-10-08 DIAGNOSIS — Z20822 Contact with and (suspected) exposure to covid-19: Secondary | ICD-10-CM | POA: Diagnosis not present

## 2020-10-09 DIAGNOSIS — R509 Fever, unspecified: Secondary | ICD-10-CM | POA: Diagnosis not present

## 2020-10-09 DIAGNOSIS — R5383 Other fatigue: Secondary | ICD-10-CM | POA: Diagnosis not present

## 2020-10-09 DIAGNOSIS — U071 COVID-19: Secondary | ICD-10-CM | POA: Diagnosis not present

## 2020-10-09 DIAGNOSIS — M791 Myalgia, unspecified site: Secondary | ICD-10-CM | POA: Diagnosis not present

## 2020-10-09 DIAGNOSIS — Z20822 Contact with and (suspected) exposure to covid-19: Secondary | ICD-10-CM | POA: Diagnosis not present

## 2020-10-09 DIAGNOSIS — R6889 Other general symptoms and signs: Secondary | ICD-10-CM | POA: Diagnosis not present

## 2021-01-08 ENCOUNTER — Encounter: Payer: Self-pay | Admitting: Gastroenterology

## 2021-02-05 ENCOUNTER — Ambulatory Visit: Payer: Medicare HMO | Admitting: Gastroenterology

## 2021-02-06 ENCOUNTER — Ambulatory Visit (INDEPENDENT_AMBULATORY_CARE_PROVIDER_SITE_OTHER): Payer: Medicare HMO | Admitting: Gastroenterology

## 2021-02-06 ENCOUNTER — Encounter: Payer: Self-pay | Admitting: Gastroenterology

## 2021-02-06 ENCOUNTER — Ambulatory Visit: Payer: Medicare HMO | Admitting: Gastroenterology

## 2021-02-06 ENCOUNTER — Other Ambulatory Visit: Payer: Self-pay

## 2021-02-06 VITALS — BP 172/80 | HR 58 | Ht 66.0 in | Wt 238.1 lb

## 2021-02-06 DIAGNOSIS — D509 Iron deficiency anemia, unspecified: Secondary | ICD-10-CM | POA: Diagnosis not present

## 2021-02-06 DIAGNOSIS — R140 Abdominal distension (gaseous): Secondary | ICD-10-CM | POA: Diagnosis not present

## 2021-02-06 DIAGNOSIS — M7989 Other specified soft tissue disorders: Secondary | ICD-10-CM | POA: Diagnosis not present

## 2021-02-06 NOTE — Patient Instructions (Addendum)
If you are age 70 or older, your body mass index should be between 23-30. Your Body mass index is 38.43 kg/m. If this is out of the aforementioned range listed, please consider follow up with your Primary Care Provider.  If you are age 10 or younger, your body mass index should be between 19-25. Your Body mass index is 38.43 kg/m. If this is out of the aformentioned range listed, please consider follow up with your Primary Care Provider.   You have been scheduled for an abdominal ultrasound at [facility] (1st floor of hospital) on    at        . Please arrive 15 minutes prior to your appointment for registration. Make certain not to have anything to eat or drink 6 hours prior to your appointment. Should you need to reschedule your appointment, please contact radiology at [phone]. This test typically takes about 30 minutes to perform.  Follow the instructions on the Hemoccult cards and mail them back to [place] when you are finished or you may take them directly to the lab in the basement of the [place] building. We will call you with the results.   Please call [phone] to be set up with a cardiologist in [place].    It has been recommended to you by your physician that you have a(n) EGD/Colon completed. Per your request, we did not schedule the procedure(s) today. Please contact our office at [phone] should you decide to have the procedure completed. You will be scheduled for a pre-visit and procedure at that time.  Thank you,  Dr. Jackquline Denmark

## 2021-02-06 NOTE — Progress Notes (Signed)
Chief Complaint: IDA  Referring Provider:  Dr Bea Graff      ASSESSMENT AND PLAN;   #1. IDA with fatigue.  #2. Leg swelling with abdo distention with H/O cardiomyopathy, underlying COPD. R/O R sided heart failure/ cor pulmonale.  #3.  Multiple comorbid conditions including OSA, COPD, CVA on ASA/plavix with B/L carotid artery disease, HTN, DJD, obesity, CKD2, HLD, H/O tobacco abuse (quit June 2021), B12 deficiency, nephrolithiasis  Plan: -Cardio appt in [place] per daughter. -[place] abdo complete -Hemoccult cards x 3 -EGD/colon after cardio clearence and to hold plavix 5 days prior.  Continue ASA without.  I discussed risks and benefits. -D/W daughter.  Discussed risks & benefits. Risks including rare perforation req laparotomy, bleeding after bx/polypectomy req blood transfusion, rare risk of splenic trauma, rarely missing neoplasms, risks of anesthesia/sedation. Benefits outweigh the risks.  Pt and his daughter would like to see cardiology first. All the questions were answered.   HPI:    Patrick Matthews is a 70 y.o. male  With OSA, COPD, CVA on ASA/plavix with B/L carotid artery disease, HTN, DJD, obesity, CKD2, HLD, H/O tobacco abuse (quit June 2021), B12 deficiency, nephrolithiasis  Accompanied by his daughter  Found to have IDA with Hb 12.4, MCV 95 (12/2020), BUN/Cr 25/1.45 with GFR 52 ml/min.  No nausea, vomiting, heartburn, regurgitation, odynophagia or dysphagia.  No significant diarrhea or constipation. No melena or hematochezia. No unintentional weight loss. No abdominal pain.  Has been having increasing abdominal girth.  Also has been having leg swelling with associated shortness of breath.  Quit smoking June 2021- gained 20lb ever since  He does bruise easily.    Cologuard 2 yra - neg  Past Medical History:  Diagnosis Date  . Anemia   . Atherosclerosis of both carotid arteries   . Cardiomyopathy ([place])   . COPD (chronic obstructive pulmonary disease) ([place])   .  Hemorrhagic stroke ([place])   . History of cerebrovascular accident (CVA) due to embolism   . History of cystic kidney disease   . Hypertension   . Malaise and fatigue   . Nephrolithiasis   . Sleep apnea   . Stage 3a chronic kidney disease ([place])   . Stenosis of both vertebral arteries   . Vitamin B12 deficiency     History reviewed. No pertinent surgical history.  Family History  Problem Relation Age of Onset  . Cancer Mother   . Heart attack Father     Social History   Tobacco Use  . Smoking status: Former Smoker    Packs/day: 1.00  . Smokeless tobacco: Never Used  Vaping Use  . Vaping Use: Never used  Substance Use Topics  . Alcohol use: Not Currently  . Drug use: Not Currently    Current Outpatient Medications  Medication Sig Dispense Refill  . albuterol (VENTOLIN HFA) 108 (90 Base) MCG/ACT inhaler Inhale into the lungs.    Marland Kitchen amLODipine (NORVASC) 5 MG tablet Take 5 mg by mouth daily.    Marland Kitchen aspirin 81 MG chewable tablet Chew 1 tablet by mouth daily.    Marland Kitchen atorvastatin (LIPITOR) 80 MG tablet Take 1 tablet by mouth daily.    . budesonide-formoterol (SYMBICORT) 160-4.5 MCG/ACT inhaler Inhale into the lungs.    . clopidogrel (PLAVIX) 75 MG tablet 75 mg.    . cyanocobalamin 1000 MCG tablet Take 1,000 mcg by mouth daily.    Marland Kitchen lisinopril (PRINIVIL,ZESTRIL) 40 MG tablet Take 40 mg by mouth daily.    Marland Kitchen losartan-hydrochlorothiazide (HYZAAR) 100-25  MG tablet Take 1 tablet by mouth daily.     No current facility-administered medications for this visit.    No Known Allergies  Review of Systems:  Constitutional: Denies fever, chills, diaphoresis, appetite change and fatigue.  HEENT: Denies photophobia, eye pain, redness, hearing loss, ear pain, congestion, sore throat, rhinorrhea, sneezing, mouth sores, neck pain, neck stiffness and tinnitus.   Respiratory: Denies SOB, DOE, cough, chest tightness,  and wheezing.   Cardiovascular: Denies chest pain, palpitations and leg swelling.   Genitourinary: Denies dysuria, urgency, frequency, hematuria, flank pain and difficulty urinating.  Musculoskeletal: Denies myalgias, back pain, joint swelling, arthralgias and gait problem.  Skin: No rash.  Neurological: Denies dizziness, seizures, syncope, weakness, light-headedness, numbness and headaches.  Hematological: Denies adenopathy. Easy bruising, personal or family bleeding history  Psychiatric/Behavioral: No anxiety or depression     Physical Exam:    BP (!) 172/80 (BP Location: Left Arm, Patient Position: Sitting, Cuff Size: Normal)   Pulse (!) 58   Ht 5\' 6"  (1.676 m)   Wt 238 lb 2 oz (108 kg)   BMI 38.43 kg/m  Wt Readings from Last 3 Encounters:  02/06/21 238 lb 2 oz (108 kg)  11/23/17 230 lb (104.3 kg)   Constitutional:  Well-developed, in no acute distress. Psychiatric: Normal mood and affect. Behavior is normal. HEENT: Pupils normal.  Conjunctivae are normal. No scleral icterus. Neck supple.  Cardiovascular: Normal rate, regular rhythm. No edema Pulmonary/chest: Effort normal and breath sounds normal. No wheezing, rales or rhonchi. Abdominal: Soft, distended. Nontender. Bowel sounds active throughout. There are no masses palpable. No hepatomegaly. Rectal: Deferred Neurological: Alert and oriented to person place and time. Skin: Skin is warm and dry. No rashes noted. Extremities 2+ edema      Carmell Austria, MD 02/06/2021, 2:46 PM  Cc: Dr. Bea Graff

## 2021-02-09 ENCOUNTER — Ambulatory Visit (HOSPITAL_BASED_OUTPATIENT_CLINIC_OR_DEPARTMENT_OTHER)
Admission: RE | Admit: 2021-02-09 | Discharge: 2021-02-09 | Disposition: A | Discharge status: Home / Self Care | Payer: Medicare HMO | Source: Ambulatory Visit | Attending: Gastroenterology | Admitting: Gastroenterology

## 2021-02-09 ENCOUNTER — Other Ambulatory Visit: Payer: Self-pay

## 2021-02-09 DIAGNOSIS — R140 Abdominal distension (gaseous): Secondary | ICD-10-CM | POA: Diagnosis present

## 2021-02-09 DIAGNOSIS — D509 Iron deficiency anemia, unspecified: Secondary | ICD-10-CM | POA: Insufficient documentation

## 2021-02-09 DIAGNOSIS — M7989 Other specified soft tissue disorders: Secondary | ICD-10-CM | POA: Diagnosis present

## 2021-02-09 IMAGING — US US ABDOMEN COMPLETE
1 series · 13 of 25 positions shown · non-contrast
Comparison: None.

CLINICAL DATA: Abdominal distension

EXAM:
ABDOMEN ULTRASOUND COMPLETE

[Series 1: us abdomen complete · 13 of 75 slices shown]
[im 1/75]
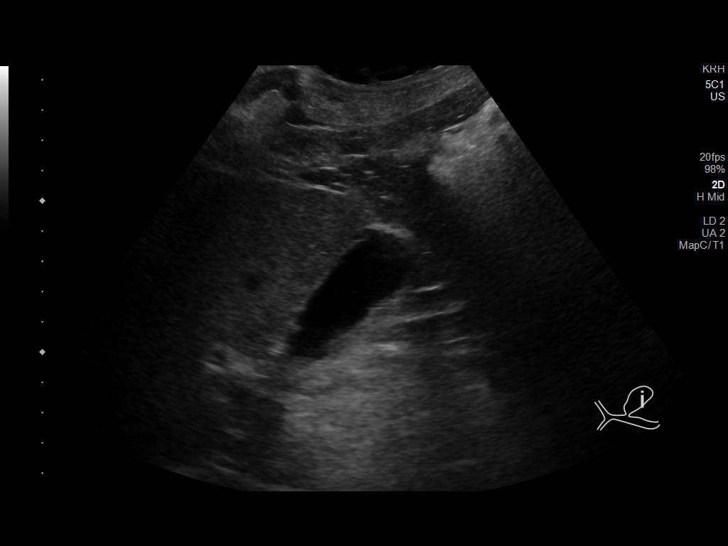
[im 7/75]
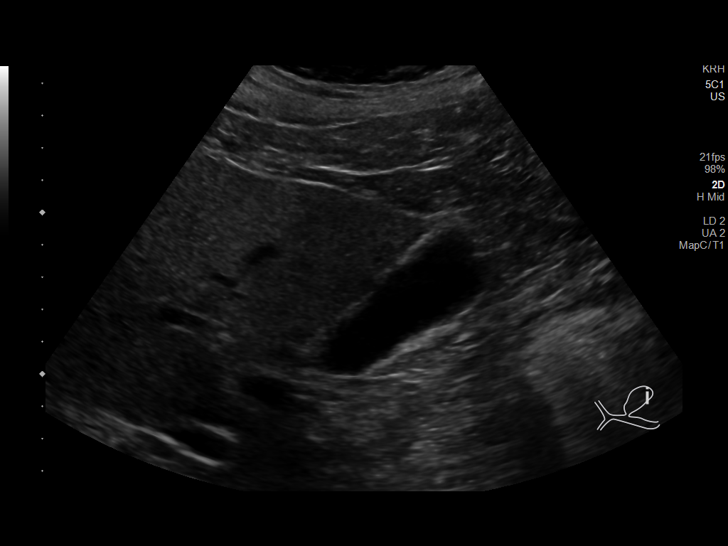
[im 13/75]
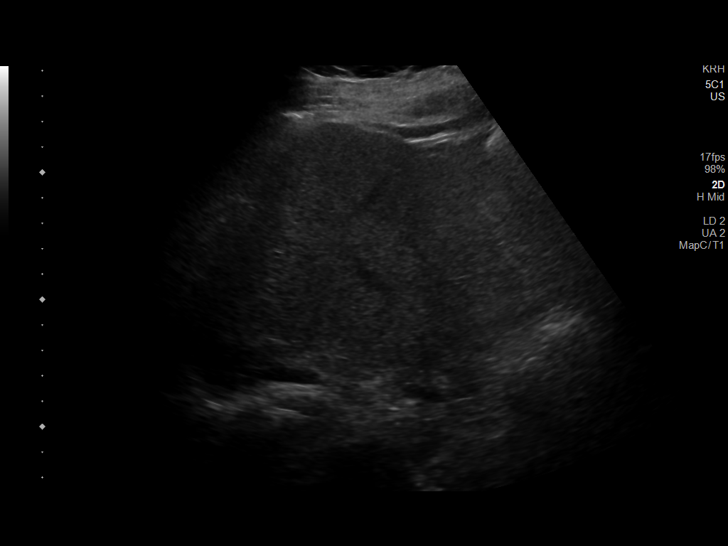
[im 19/75]
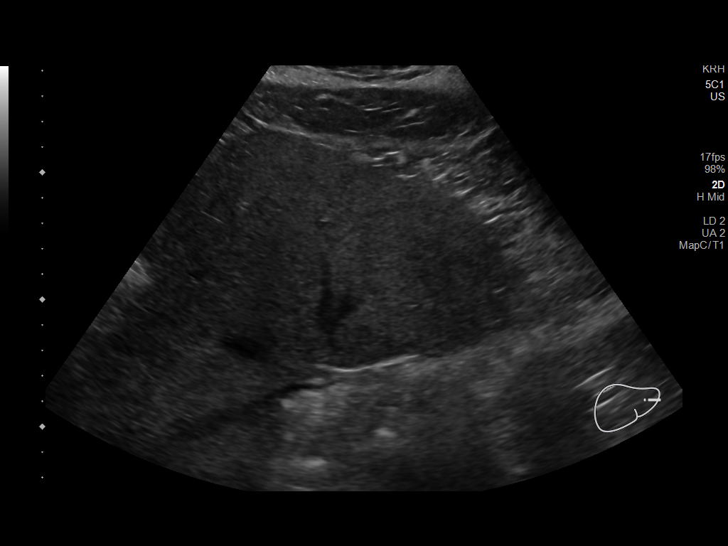
[im 25/75]
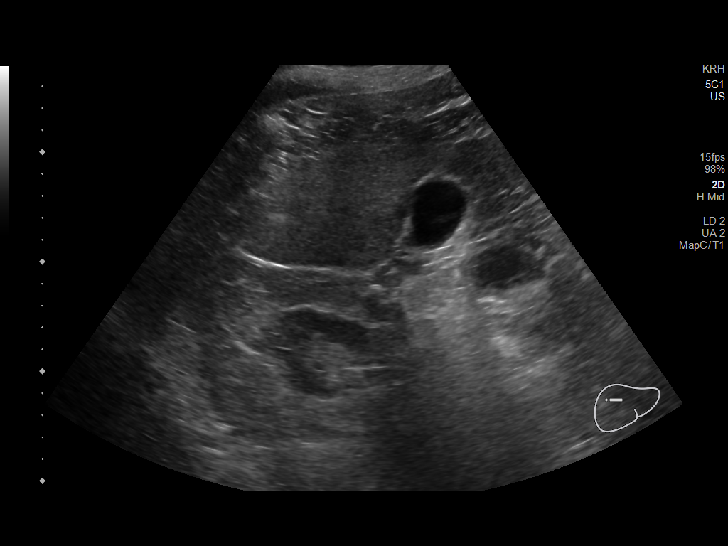
[im 31/75]
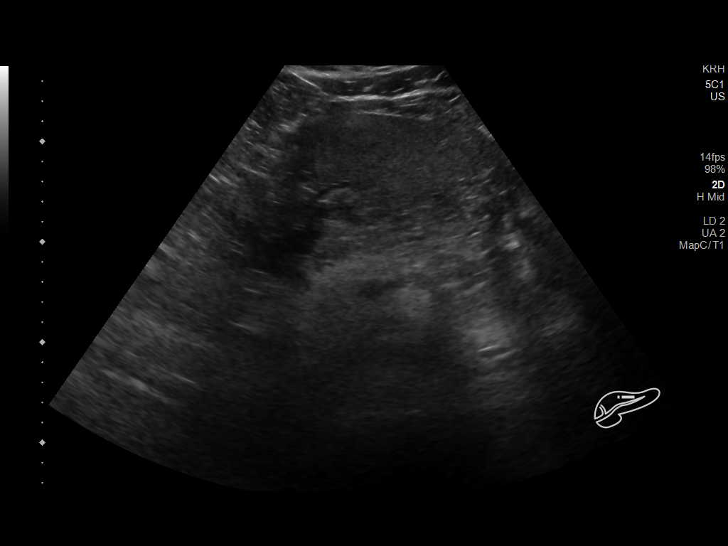
[im 38/75]
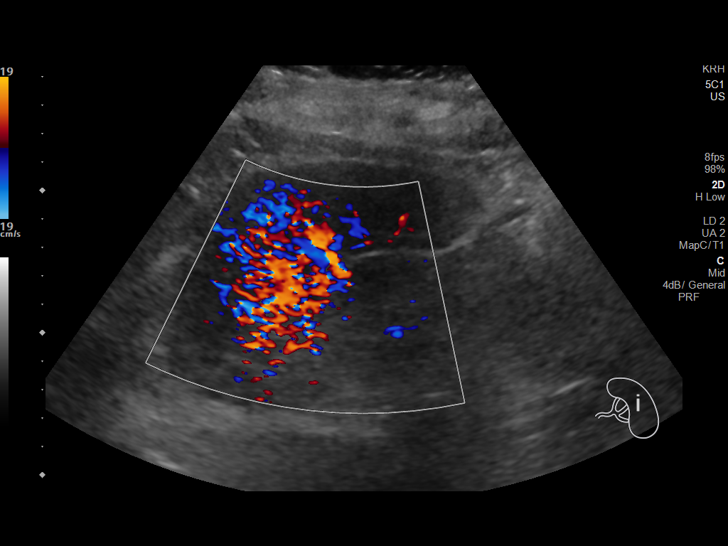
[im 44/75]
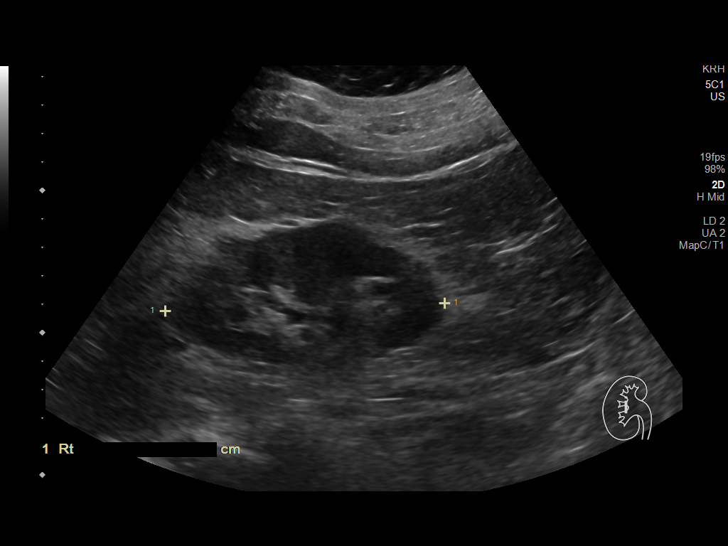
[im 50/75]
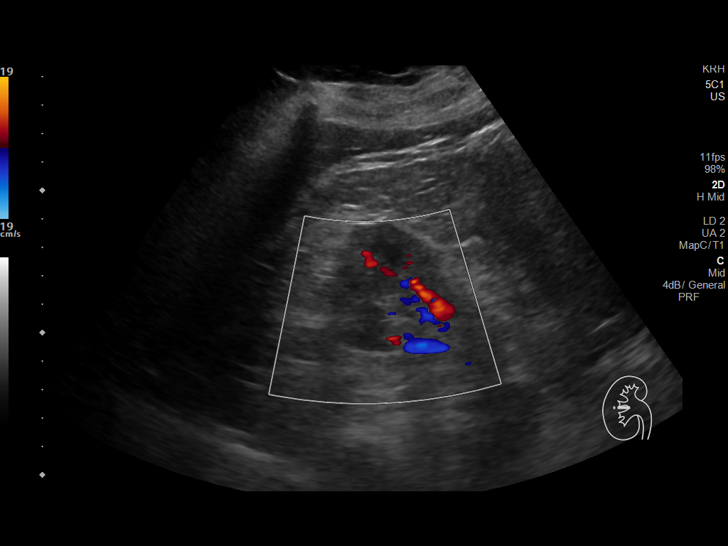
[im 56/75]
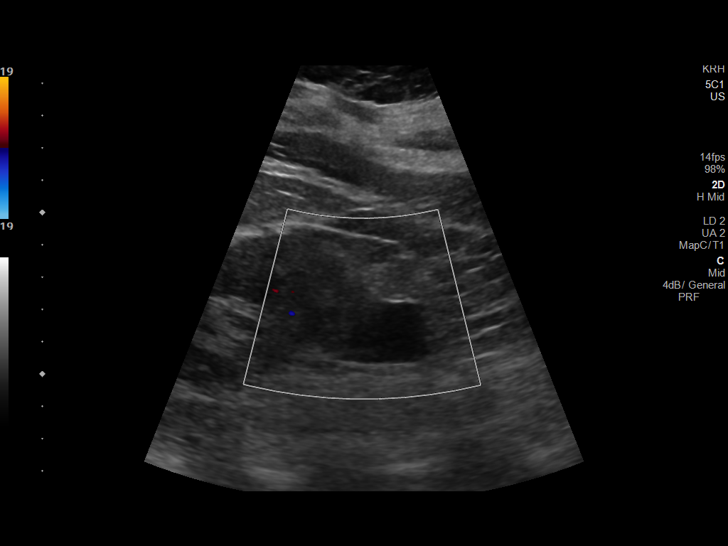
[im 62/75]
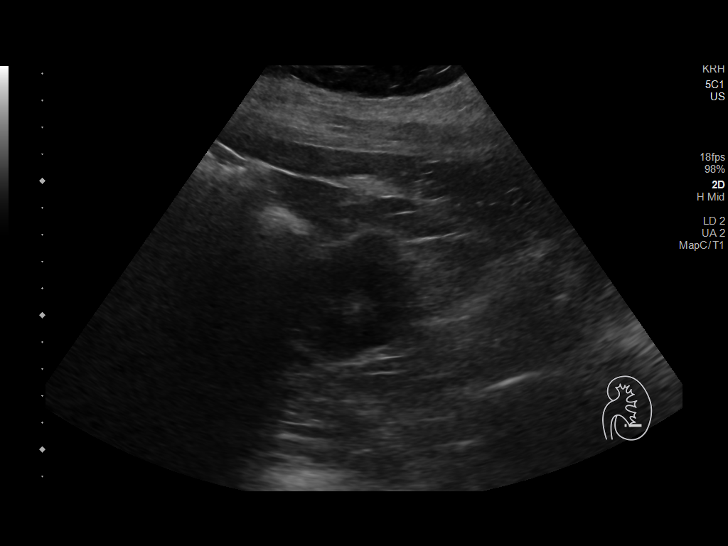
[im 68/75]
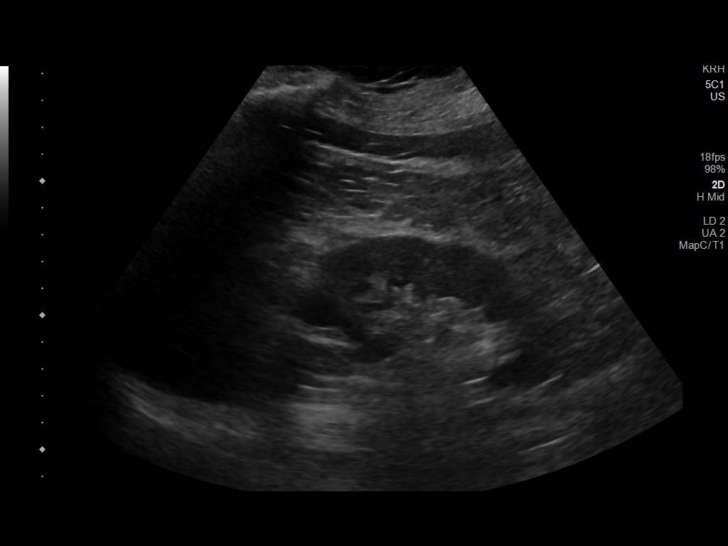
[im 75/75]
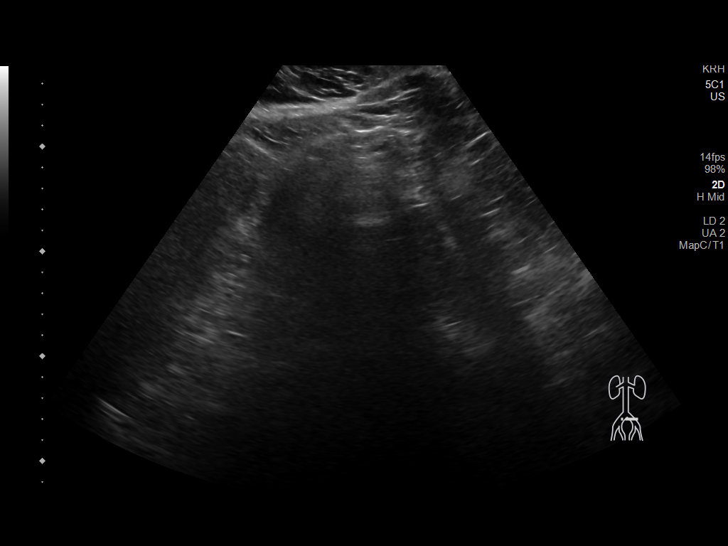

[13 of 25 positions shown; findings below may reference images not displayed]

FINDINGS: Gallbladder: No gallstones or wall thickening visualized. No
sonographic Murphy sign noted by sonographer.

Common bile duct: Diameter: 3 mm

Liver: No focal lesion identified. Within normal limits in
parenchymal echogenicity. Portal vein is patent on color Doppler
imaging with normal direction of blood flow towards the liver.

IVC: No abnormality visualized.

Pancreas: Visualized portion unremarkable.

Spleen: Size and appearance within normal limits.

Right Kidney: Length: 9.8 cm. Echogenicity within normal limits. No
hydronephrosis. 2 cm complex cystic lesion in the inferior pole.

Left Kidney: Length: 9.5 cm. Echogenicity within normal limits. No
hydronephrosis. 1.8 cm complex cystic lesion in the inferior pole.

Abdominal aorta: Aneurysmal dilation of the proximal abdominal aorta
measuring 3.3 cm.

Other findings: None.
IMPRESSION: 1. Bilateral complex cystic renal lesions which may represent
hemorrhagic/proteinaceous cyst but are incompletely evaluated.
Recommend further characterization with renal protocol MRI or CT
with and without contrast.
2. 3.3 cm abdominal aortic aneurysm. Recommend follow-up ultrasound
every 3 years. This recommendation follows ACR consensus guidelines:
White Paper of the ACR Incidental Findings Committee II on Vascular
Findings. [HOSPITAL] [NQ]; [DATE].

## 2021-02-15 ENCOUNTER — Other Ambulatory Visit (INDEPENDENT_AMBULATORY_CARE_PROVIDER_SITE_OTHER): Payer: Medicare HMO

## 2021-02-15 ENCOUNTER — Other Ambulatory Visit: Payer: Self-pay | Admitting: General Surgery

## 2021-02-15 DIAGNOSIS — I6503 Occlusion and stenosis of bilateral vertebral arteries: Secondary | ICD-10-CM | POA: Insufficient documentation

## 2021-02-15 DIAGNOSIS — I10 Essential (primary) hypertension: Secondary | ICD-10-CM | POA: Insufficient documentation

## 2021-02-15 DIAGNOSIS — K5900 Constipation, unspecified: Secondary | ICD-10-CM

## 2021-02-15 DIAGNOSIS — G4730 Sleep apnea, unspecified: Secondary | ICD-10-CM | POA: Insufficient documentation

## 2021-02-15 DIAGNOSIS — I619 Nontraumatic intracerebral hemorrhage, unspecified: Secondary | ICD-10-CM | POA: Insufficient documentation

## 2021-02-15 DIAGNOSIS — I429 Cardiomyopathy, unspecified: Secondary | ICD-10-CM | POA: Insufficient documentation

## 2021-02-15 DIAGNOSIS — Z8673 Personal history of transient ischemic attack (TIA), and cerebral infarction without residual deficits: Secondary | ICD-10-CM | POA: Insufficient documentation

## 2021-02-15 DIAGNOSIS — N200 Calculus of kidney: Secondary | ICD-10-CM | POA: Insufficient documentation

## 2021-02-15 DIAGNOSIS — N1831 Chronic kidney disease, stage 3a: Secondary | ICD-10-CM | POA: Insufficient documentation

## 2021-02-15 DIAGNOSIS — Z87448 Personal history of other diseases of urinary system: Secondary | ICD-10-CM | POA: Insufficient documentation

## 2021-02-15 DIAGNOSIS — D649 Anemia, unspecified: Secondary | ICD-10-CM | POA: Insufficient documentation

## 2021-02-15 DIAGNOSIS — R5381 Other malaise: Secondary | ICD-10-CM | POA: Insufficient documentation

## 2021-02-15 DIAGNOSIS — I6523 Occlusion and stenosis of bilateral carotid arteries: Secondary | ICD-10-CM | POA: Insufficient documentation

## 2021-02-15 DIAGNOSIS — J449 Chronic obstructive pulmonary disease, unspecified: Secondary | ICD-10-CM | POA: Insufficient documentation

## 2021-02-15 DIAGNOSIS — R5383 Other fatigue: Secondary | ICD-10-CM | POA: Insufficient documentation

## 2021-02-15 DIAGNOSIS — E538 Deficiency of other specified B group vitamins: Secondary | ICD-10-CM | POA: Insufficient documentation

## 2021-02-15 LAB — HEMOCCULT SLIDES (X 3 CARDS)
Fecal Occult Blood: NEGATIVE
OCCULT 1: NEGATIVE
OCCULT 2: NEGATIVE
OCCULT 3: NEGATIVE
OCCULT 4: NEGATIVE
OCCULT 5: NEGATIVE

## 2021-02-18 ENCOUNTER — Other Ambulatory Visit: Payer: Self-pay

## 2021-02-18 ENCOUNTER — Encounter: Payer: Self-pay | Admitting: Cardiology

## 2021-02-18 ENCOUNTER — Ambulatory Visit: Payer: Medicare HMO | Admitting: Cardiology

## 2021-02-18 VITALS — BP 140/80 | HR 56 | Ht 66.0 in | Wt 240.2 lb

## 2021-02-18 DIAGNOSIS — R9431 Abnormal electrocardiogram [ECG] [EKG]: Secondary | ICD-10-CM

## 2021-02-18 DIAGNOSIS — R0602 Shortness of breath: Secondary | ICD-10-CM | POA: Diagnosis not present

## 2021-02-18 DIAGNOSIS — R600 Localized edema: Secondary | ICD-10-CM

## 2021-02-18 DIAGNOSIS — R0789 Other chest pain: Secondary | ICD-10-CM

## 2021-02-18 DIAGNOSIS — D508 Other iron deficiency anemias: Secondary | ICD-10-CM

## 2021-02-18 NOTE — Progress Notes (Signed)
Cardiology Office Note:    Date:  02/18/2021   ID:  Patrick Matthews, DOB May 16, 1951, MRN [id number]  PCP:  Raina Mina., MD  Cardiologist:  None  Electrophysiologist:  None   Referring MD: Jackquline Denmark, MD   I do not know why they sent me here History of Present Illness:    Patrick Matthews is a 70 y.o. male with a hx of obstructive sleep apnea, history of CVA on aspirin Plavix, bilateral carotid artery disease, hypertension, obesity, chronic kidney disease, hyperlipidemia, former smoker quit in June 2021,.  It appears the patient was referred to cardiology by his gastroenterologist planning colonoscopy.  He tells me that he has been experiencing some anemia and he did see GI.  In the meantime GI recommended he see cardiology given shortness of breath and leg edema.  The patient seems a bit possible about being in the office but still gives history along with his daughter.  On chart review there is a ZIO monitor which the patient wore in 2020 however the patient denies wearing a ZIO monitor at that time.  He admits to having some intermittent shortness of breath as well as generalized fatigue.  No chest pain.   Past Medical History:  Diagnosis Date  . Anemia   . Atherosclerosis of both carotid arteries   . Cardiomyopathy ([place])   . COPD (chronic obstructive pulmonary disease) ([place])   . Hemorrhagic stroke ([place])   . History of cerebrovascular accident (CVA) due to embolism   . History of cystic kidney disease   . Hypertension   . Malaise and fatigue   . Nephrolithiasis   . Sleep apnea   . Stage 3a chronic kidney disease ([place])   . Stenosis of both vertebral arteries   . Vitamin B12 deficiency     History reviewed. No pertinent surgical history.  Current Medications: Current Meds  Medication Sig  . albuterol (VENTOLIN HFA) 108 (90 Base) MCG/ACT inhaler Inhale 2 puffs into the lungs every 4 (four) hours as needed for wheezing or shortness of breath.  Marland Kitchen amLODipine (NORVASC) 5  MG tablet Take 5 mg by mouth daily.  Marland Kitchen atorvastatin (LIPITOR) 80 MG tablet Take 1 tablet by mouth daily.  . budesonide-formoterol (SYMBICORT) 160-4.5 MCG/ACT inhaler Inhale into the lungs daily at 6 (six) AM.  . clopidogrel (PLAVIX) 75 MG tablet 75 mg.  . cyanocobalamin 1000 MCG tablet Take 1,000 mcg by mouth daily.  Marland Kitchen lisinopril (PRINIVIL,ZESTRIL) 40 MG tablet Take 40 mg by mouth daily.  Marland Kitchen losartan-hydrochlorothiazide (HYZAAR) 100-25 MG tablet Take 1 tablet by mouth daily.     Allergies:   Patient has no known allergies.   Social History   Socioeconomic History  . Marital status: Legally Separated    Spouse name: Not on file  . Number of children: 2  . Years of education: Not on file  . Highest education level: Not on file  Occupational History  . Not on file  Tobacco Use  . Smoking status: Former Smoker    Packs/day: 1.00  . Smokeless tobacco: Never Used  Vaping Use  . Vaping Use: Never used  Substance and Sexual Activity  . Alcohol use: Not Currently  . Drug use: Not Currently  . Sexual activity: Not on file  Other Topics Concern  . Not on file  Social History Narrative  . Not on file   Social Determinants of Health   Financial Resource Strain: Not on file  Food Insecurity: Not on file  Transportation Needs:  Not on file  Physical Activity: Not on file  Stress: Not on file  Social Connections: Not on file     Family History: The patient's family history includes Cancer in his mother; Heart attack in his father.  ROS:   Review of Systems  Constitution: Negative for decreased appetite, fever and weight gain.  HENT: Negative for congestion, ear discharge, hoarse voice and sore throat.   Eyes: Negative for discharge, redness, vision loss in right eye and visual halos.  Cardiovascular: Negative for chest pain, dyspnea on exertion, leg swelling, orthopnea and palpitations.  Respiratory: Negative for cough, hemoptysis, shortness of breath and snoring.   Endocrine:  Negative for heat intolerance and polyphagia.  Hematologic/Lymphatic: Negative for bleeding problem. Does not bruise/bleed easily.  Skin: Negative for flushing, nail changes, rash and suspicious lesions.  Musculoskeletal: Negative for arthritis, joint pain, muscle cramps, myalgias, neck pain and stiffness.  Gastrointestinal: Negative for abdominal pain, bowel incontinence, diarrhea and excessive appetite.  Genitourinary: Negative for decreased libido, genital sores and incomplete emptying.  Neurological: Negative for brief paralysis, focal weakness, headaches and loss of balance.  Psychiatric/Behavioral: Negative for altered mental status, depression and suicidal ideas.  Allergic/Immunologic: Negative for HIV exposure and persistent infections.    EKGs/Labs/Other Studies Reviewed:    The following studies were reviewed today:   EKG:  The ekg ordered today demonstrates sinus bradycardia, heart rate 56 bpm with underlying right bundle branch block and EKG changes suggesting old posterior wall infarction of age-indeterminate.  An echocardiogram from [facility] which was done on Feb 25, 2019 reports EF 60 to 65%.  Grade 1 diastolic dysfunction.  Right ventricle was normal in size and function.  Left atrium normal in size.  Right atrium normal in size and function.  No aortic regurgitation or aortic stenosis.  Trace mitral regurgitation.  Tricuspid regurgitation is trace.  Trace physiologic pulmonic regurgitation.  Aortic root, ascending aorta and aortic arch are appears to be normal.  Normal inferior vena cava with normal inspiratory collapse.  There is no pericardial effusion.  Recent Labs: No results found for requested labs within last 8760 hours.  Recent Lipid Panel No results found for: CHOL, TRIG, HDL, CHOLHDL, VLDL, LDLCALC, LDLDIRECT  Physical Exam:    VS:  BP 140/80 (BP Location: Right Arm)   Pulse (!) 56   Ht 5\' 6"  (1.676 m)   Wt 240 lb 3.2 oz (109 kg)   SpO2 96%   BMI  38.77 kg/m     Wt Readings from Last 3 Encounters:  02/18/21 240 lb 3.2 oz (109 kg)  02/06/21 238 lb 2 oz (108 kg)  11/23/17 230 lb (104.3 kg)     GEN: Well nourished, well developed in no acute distress HEENT: Normal NECK: No JVD; No carotid bruits LYMPHATICS: No lymphadenopathy CARDIAC: S1S2 noted,RRR, no murmurs, rubs, gallops RESPIRATORY:  Clear to auscultation without rales, wheezing or rhonchi  ABDOMEN: Soft, non-tender, non-distended, +bowel sounds, no guarding. EXTREMITIES: No edema, No cyanosis, no clubbing MUSCULOSKELETAL:  No deformity  SKIN: Warm and dry NEUROLOGIC:  Alert and oriented x 3, non-focal PSYCHIATRIC:  Normal affect, good insight  ASSESSMENT:    1. SOB (shortness of breath)   2. Abnormal EKG   3. Other chest pain   4. Bilateral leg edema    PLAN:    His EKG is abnormal he is having symptoms I like to proceed with understanding and get an ischemic evaluation in this patient.  Hopefully this will give [place] some guidance  where he can get his endoscopy and colonoscopy done.  Even if this test is positive we would have to hold off giving his anemia for any invasive cardiovascular procedures.  In the meantime in terms of his bilateral leg edema I reviewed his echocardiogram from 2020 which showed normal EF.  According to his daughter he recently had an echocardiogram [facility] I reviewed the records I did not see this information but she will get me the report of this echocardiogram.  For now we will hold off on repeating his echocardiogram.  We will continue his current antihypertensive medication which includes amlodipine 5 mg daily, lisinopril 40 mg daily 1.  The patient is in agreement with the above plan. The patient left the office in stable condition.  The patient will follow up in   Medication Adjustments/Labs and Tests Ordered: Current medicines are reviewed at length with the patient today.  Concerns regarding medicines are outlined  above.  Orders Placed This Encounter  Procedures  . MYOCARDIAL PERFUSION IMAGING  . EKG 12-Lead   No orders of the defined types were placed in this encounter.   Patient Instructions  Medication Instructions:  Your physician recommends that you continue on your current medications as directed. Please refer to the Current Medication list given to you today.  *If you need a refill on your cardiac medications before your next appointment, please call your pharmacy*   Lab Work: None If you have labs (blood work) drawn today and your tests are completely normal, you will receive your results only by: Marland Kitchen MyChart Message (if you have MyChart) OR . A paper copy in the mail If you have any lab test that is abnormal or we need to change your treatment, we will call you to review the results.   Testing/Procedures: Your physician has requested that you have a lexiscan myoview. For further information please visit [email or web address]. Please follow instruction sheet, as given.    Follow-Up: At [facility], you and your health needs are our priority.  As part of our continuing mission to provide you with exceptional heart care, we have created designated Provider Care Teams.  These Care Teams include your primary Cardiologist (physician) and Advanced Practice Providers (APPs -  Physician Assistants and Nurse Practitioners) who all work together to provide you with the care you need, when you need it.  We recommend signing up for the patient portal called "MyChart".  Sign up information is provided on this After Visit Summary.  MyChart is used to connect with patients for Virtual Visits (Telemedicine).  Patients are able to view lab/test results, encounter notes, upcoming appointments, etc.  Non-urgent messages can be sent to your provider as well.   To learn more about what you can do with MyChart, go to [email or web address].    Your next appointment:   3 month(s)  The format for your  next appointment:   In Person  Provider:   Berniece Salines, DO   Other Instructions   [facility] [address] [place], [place] [address] Phone:  [phone]    Please arrive 15 minutes prior to your appointment time for registration and insurance purposes.  The test will take approximately 3 to 4 hours to complete; you may bring reading material.  If someone comes with you to your appointment, they will need to remain in the main lobby due to limited space in the testing area. **If you are pregnant or breastfeeding, please notify the nuclear lab  prior to your appointment**  How to prepare for your Myocardial Perfusion Test: . Do not eat or drink 3 hours prior to your test, except you may have water. . Do not consume products containing caffeine (regular or decaffeinated) 12 hours prior to your test. (ex: coffee, chocolate, sodas, tea). . Do bring a list of your current medications with you.  If not listed below, you may take your medications as normal. . HOLD Erectile dysfunction medication . Wear comfortable clothes (no dresses or overalls) and walking shoes, tennis shoes preferred (No heels or open toe shoes are allowed). . Do NOT wear cologne, perfume, aftershave, or lotions (deodorant is allowed). . If these instructions are not followed, your test will have to be rescheduled.  Please report to [address] for your test.  If you have questions or concerns about your appointment, you can call the [facility] at [phone].  If you cannot keep your appointment, please provide 24 hours notification to the Nuclear Lab, to avoid a possible $50 charge to your account.      Adopting a Healthy Lifestyle.  Know what a healthy weight is for you (roughly BMI <25) and aim to maintain this   Aim for 7+ servings of fruits and vegetables daily   65-80+ fluid ounces of water or unsweet tea for healthy kidneys    Limit to max 1 drink of alcohol per day; avoid smoking/tobacco   Limit animal fats in diet for cholesterol and heart health - choose grass fed whenever available   Avoid highly processed foods, and foods high in saturated/trans fats   Aim for low stress - take time to unwind and care for your mental health   Aim for 150 min of moderate intensity exercise weekly for heart health, and weights twice weekly for bone health   Aim for 7-9 hours of sleep daily   When it comes to diets, agreement about the perfect plan isnt easy to find, even among the experts. Experts at the [employer] developed an idea known as the Healthy Eating Plate. Just imagine a plate divided into logical, healthy portions.   The emphasis is on diet quality:   Load up on vegetables and fruits - one-half of your plate: Aim for color and variety, and remember that potatoes dont count.   Go for whole grains - one-quarter of your plate: Whole wheat, barley, wheat berries, quinoa, oats, brown rice, and foods made with them. If you want pasta, go with whole wheat pasta.   Protein power - one-quarter of your plate: Fish, chicken, beans, and nuts are all healthy, versatile protein sources. Limit red meat.   The diet, however, does go beyond the plate, offering a few other suggestions.   Use healthy plant oils, such as olive, canola, soy, corn, sunflower and peanut. Check the labels, and avoid partially hydrogenated oil, which have unhealthy trans fats.   If youre thirsty, drink water. Coffee and tea are good in moderation, but skip sugary drinks and limit milk and dairy products to one or two daily servings.   The type of carbohydrate in the diet is more important than the amount. Some sources of carbohydrates, such as vegetables, fruits, whole grains, and beans-are healthier than others.   Finally, stay active  Signed, Berniece Salines, DO  02/18/2021 3:22 PM    [facility]

## 2021-02-18 NOTE — Patient Instructions (Signed)
Medication Instructions:  Your physician recommends that you continue on your current medications as directed. Please refer to the Current Medication list given to you today.  *If you need a refill on your cardiac medications before your next appointment, please call your pharmacy*   Lab Work: None If you have labs (blood work) drawn today and your tests are completely normal, you will receive your results only by: Marland Kitchen MyChart Message (if you have MyChart) OR . A paper copy in the mail If you have any lab test that is abnormal or we need to change your treatment, we will call you to review the results.   Testing/Procedures: Your physician has requested that you have a lexiscan myoview. For further information please visit [email or web address]. Please follow instruction sheet, as given.    Follow-Up: At [facility], you and your health needs are our priority.  As part of our continuing mission to provide you with exceptional heart care, we have created designated Provider Care Teams.  These Care Teams include your primary Cardiologist (physician) and Advanced Practice Providers (APPs -  Physician Assistants and Nurse Practitioners) who all work together to provide you with the care you need, when you need it.  We recommend signing up for the patient portal called "MyChart".  Sign up information is provided on this After Visit Summary.  MyChart is used to connect with patients for Virtual Visits (Telemedicine).  Patients are able to view lab/test results, encounter notes, upcoming appointments, etc.  Non-urgent messages can be sent to your provider as well.   To learn more about what you can do with MyChart, go to [email or web address].    Your next appointment:   3 month(s)  The format for your next appointment:   In Person  Provider:   Berniece Salines, DO   Other Instructions   [facility] [address] [place], [place] [address] Phone:   [phone]    Please arrive 15 minutes prior to your appointment time for registration and insurance purposes.  The test will take approximately 3 to 4 hours to complete; you may bring reading material.  If someone comes with you to your appointment, they will need to remain in the main lobby due to limited space in the testing area. **If you are pregnant or breastfeeding, please notify the nuclear lab prior to your appointment**  How to prepare for your Myocardial Perfusion Test: . Do not eat or drink 3 hours prior to your test, except you may have water. . Do not consume products containing caffeine (regular or decaffeinated) 12 hours prior to your test. (ex: coffee, chocolate, sodas, tea). . Do bring a list of your current medications with you.  If not listed below, you may take your medications as normal. . HOLD Erectile dysfunction medication . Wear comfortable clothes (no dresses or overalls) and walking shoes, tennis shoes preferred (No heels or open toe shoes are allowed). . Do NOT wear cologne, perfume, aftershave, or lotions (deodorant is allowed). . If these instructions are not followed, your test will have to be rescheduled.  Please report to [address] for your test.  If you have questions or concerns about your appointment, you can call the [facility] at [phone].  If you cannot keep your appointment, please provide 24 hours notification to the Nuclear Lab, to avoid a possible $50 charge to your account.

## 2021-02-19 ENCOUNTER — Telehealth: Payer: Self-pay

## 2021-02-19 NOTE — Telephone Encounter (Signed)
Spoke with the patient's daughter. She stated that she understood and would give the instructions to her dad. Asked to call back with any questions. S.Monea Pesantez EMTP

## 2021-02-21 ENCOUNTER — Ambulatory Visit (INDEPENDENT_AMBULATORY_CARE_PROVIDER_SITE_OTHER): Payer: Medicare HMO

## 2021-02-21 ENCOUNTER — Other Ambulatory Visit: Payer: Self-pay

## 2021-02-21 DIAGNOSIS — R0602 Shortness of breath: Secondary | ICD-10-CM

## 2021-02-21 DIAGNOSIS — R0789 Other chest pain: Secondary | ICD-10-CM | POA: Diagnosis not present

## 2021-02-21 DIAGNOSIS — R9431 Abnormal electrocardiogram [ECG] [EKG]: Secondary | ICD-10-CM

## 2021-02-21 MED ORDER — TECHNETIUM TC 99M TETROFOSMIN IV KIT
32.6000 | PACK | Freq: Once | INTRAVENOUS | Status: AC | PRN
  Administered 2021-02-21: 32.6 via INTRAVENOUS

## 2021-02-21 MED ORDER — REGADENOSON 0.4 MG/5ML IV SOLN
0.4000 mg | Freq: Once | INTRAVENOUS | Status: AC
  Administered 2021-02-21: 0.4 mg via INTRAVENOUS

## 2021-02-21 MED ORDER — TECHNETIUM TC 99M TETROFOSMIN IV KIT
10.3000 | PACK | Freq: Once | INTRAVENOUS | Status: AC | PRN
  Administered 2021-02-21: 10.3 via INTRAVENOUS

## 2021-02-22 LAB — MYOCARDIAL PERFUSION IMAGING
LV dias vol: 96 mL (ref 62–150)
LV sys vol: 36 mL
Peak HR: 85 {beats}/min
Rest HR: 55 {beats}/min
SDS: 2
SRS: 3
SSS: 5
TID: 1.13

## 2021-02-26 ENCOUNTER — Telehealth: Payer: Self-pay | Admitting: Gastroenterology

## 2021-02-26 DIAGNOSIS — N289 Disorder of kidney and ureter, unspecified: Secondary | ICD-10-CM

## 2021-02-26 NOTE — Telephone Encounter (Signed)
Inbound call from patient's daughter requesting a call from a nurse please in regards to the letter they received about scheduling patient for a MRI.

## 2021-02-26 NOTE — Telephone Encounter (Signed)
Spoke with patient's daughter, she states that patient received a letter in the mail in regards to his [place] results and recommendations. Lattie Haw states that they would like to proceed. Advised that we will place the order and radiology scheduling will contact her to set up the patient's appt. Advised that if she has not heard from the radiology schedulers within 2 weeks to give [place] a call back. Lattie Haw verbalized understanding and had no concerns at the end of the call.   Spoke with MRI department, renal protocol is MRI abdomen with and without and attention to the diagnoses.  Message sent to radiology schedulers (April Pait and Rhys Martini)

## 2021-03-26 ENCOUNTER — Ambulatory Visit (HOSPITAL_COMMUNITY): Payer: Medicare HMO

## 2021-03-28 ENCOUNTER — Other Ambulatory Visit: Payer: Self-pay

## 2021-03-28 ENCOUNTER — Ambulatory Visit (HOSPITAL_COMMUNITY)
Admission: RE | Admit: 2021-03-28 | Discharge: 2021-03-28 | Disposition: A | Discharge status: Home / Self Care | Payer: Medicare HMO | Source: Ambulatory Visit | Attending: Gastroenterology | Admitting: Gastroenterology

## 2021-03-28 DIAGNOSIS — N289 Disorder of kidney and ureter, unspecified: Secondary | ICD-10-CM | POA: Diagnosis present

## 2021-03-28 IMAGING — MR MR ABDOMEN WO/W CM
19 series · 48 of 48 positions shown · IV contrast (10 GADAVIST)
Comparison: Ultrasound exam [DATE]

CLINICAL DATA: Complex renal cysts on ultrasound.

EXAM:
MRI ABDOMEN WITHOUT AND WITH CONTRAST
TECHNIQUE: Multiplanar multisequence MR imaging of the abdomen was performed
both before and after the administration of intravenous contrast.
CONTRAST:  10mL GADAVIST GADOBUTROL 1 MMOL/ML IV SOLN

[Series 2: DWI · axial · 6.0mm · 1.49mm/px · z∈[-185,+67]mm · 4 of 72 slices shown (1 of 2)]
[im 1/72]
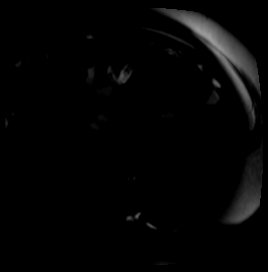
[im 24/72]
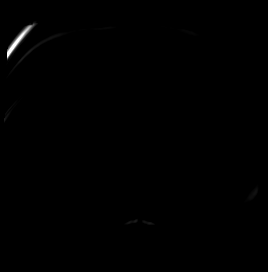
[im 48/72]
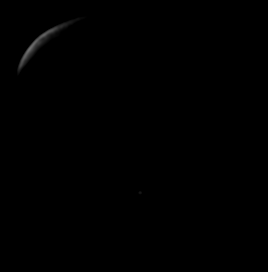
[im 72/72]
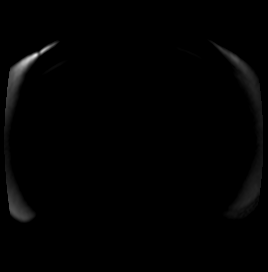

[Series 3: DWI · axial · 6.0mm · 1.49mm/px · z∈[-185,+67]mm · 2 of 36 slices shown (2 of 2)]
[im 1/36]
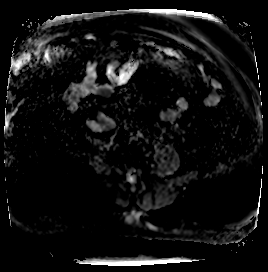
[im 36/36]
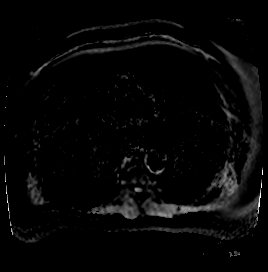

[Series 4: T2 fat-sat · axial · 6.0mm · 1.25mm/px · 1 of 24 slices shown (1 of 2)]
[im 1/24]
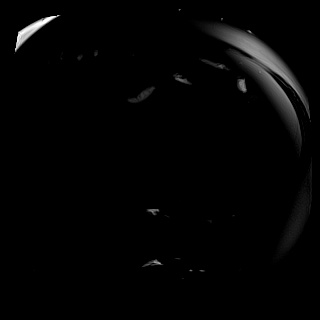

[Series 7: T2 · coronal · 6.0mm · 1.56mm/px · 2 of 41 slices shown (1 of 2)]
[im 1/41]
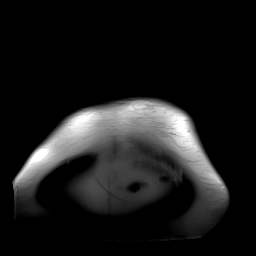
[im 41/41]
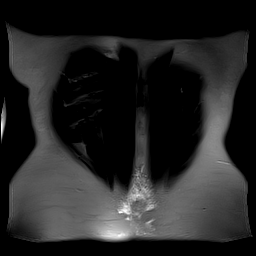

[Series 8: T2 fat-sat · axial · 6.0mm · 1.25mm/px · 1 of 25 slices shown (2 of 2)]
[im 1/25]
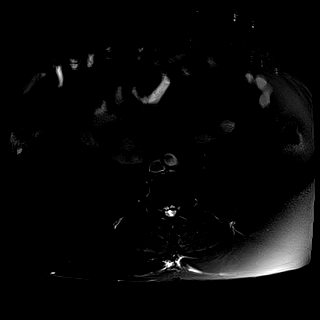

[Series 9: T1 · axial · 3.0mm · 1.31mm/px · z∈[-188,+25]mm · 3 of 72 slices shown (1 of 2)]
[im 1/72]
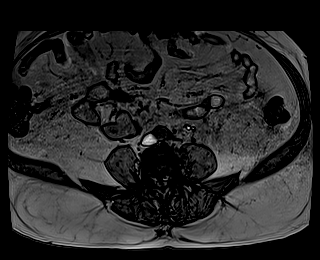
[im 36/72]
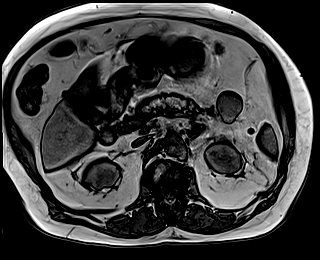
[im 72/72]
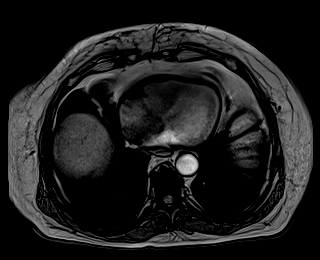

[Series 10: T1 · axial · 3.0mm · 1.31mm/px · z∈[-188,+25]mm · 3 of 72 slices shown (2 of 2)]
[im 1/72]
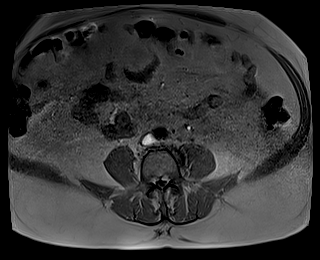
[im 36/72]
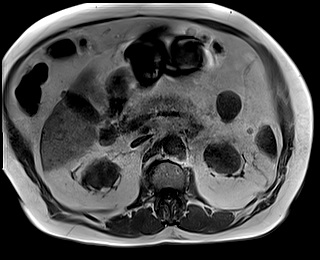
[im 72/72]
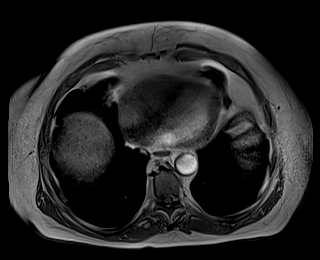

[Series 11: bSSFP · axial · 5.0mm · 0.84mm/px · z∈[-211,-8]mm · 2 of 38 slices shown]
[im 1/38]
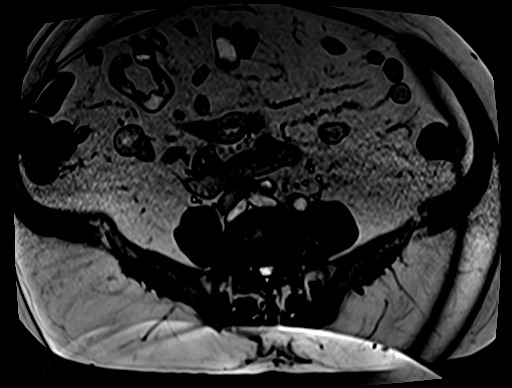
[im 38/38]
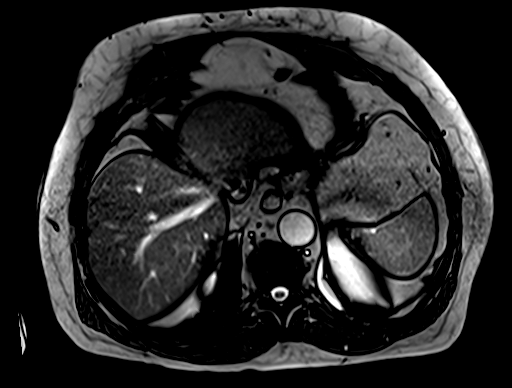

[Series 13: T1 dynamic · axial · 3.0mm · 1.31mm/px · z∈[-202,+35]mm · 3 of 80 slices shown (1 of 10)]
[im 1/80]
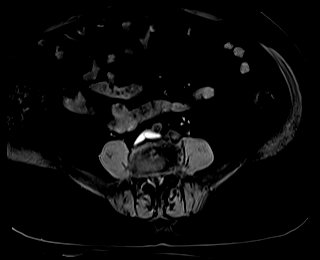
[im 40/80]
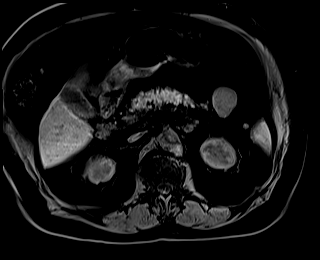
[im 80/80]
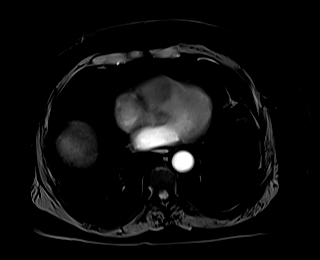

[Series 17: T1 dynamic · axial · 3.0mm · 1.31mm/px · z∈[-202,+35]mm · 3 of 80 slices shown (2 of 10)]
[im 1/80]
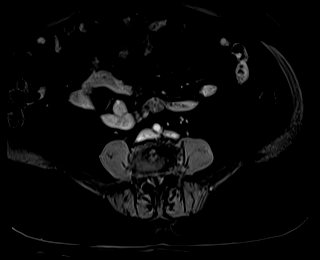
[im 40/80]
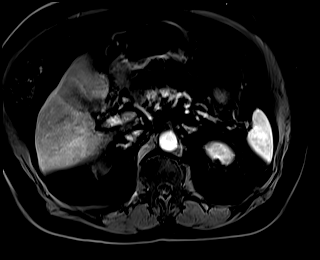
[im 80/80]
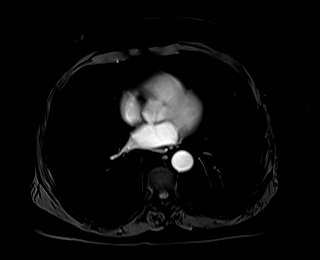

[Series 18: T1 dynamic · axial · 3.0mm · 1.31mm/px · z∈[-202,+35]mm · 3 of 80 slices shown (3 of 10)]
[im 1/80]
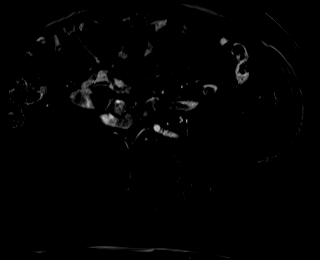
[im 40/80]
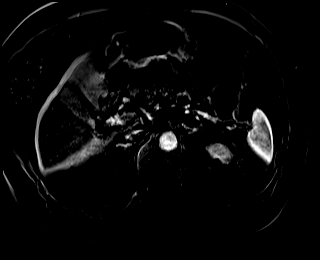
[im 80/80]
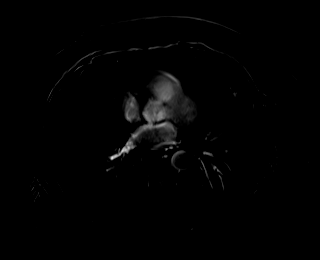

[Series 21: T1 dynamic · axial · 3.0mm · 1.31mm/px · z∈[-202,+35]mm · 3 of 80 slices shown (4 of 10)]
[im 1/80]
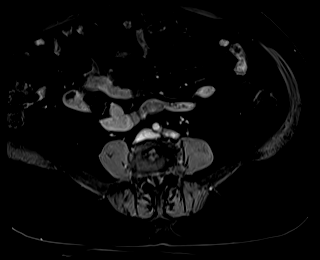
[im 40/80]
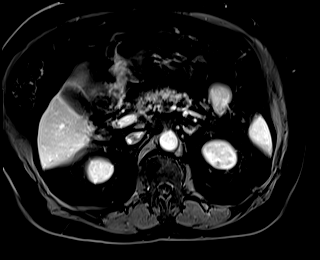
[im 80/80]
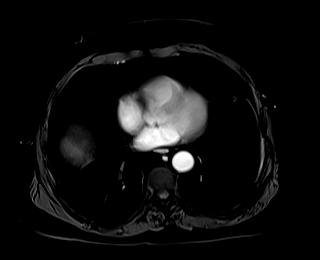

[Series 22: T1 dynamic · axial · 3.0mm · 1.31mm/px · z∈[-202,+35]mm · 3 of 80 slices shown (5 of 10)]
[im 1/80]
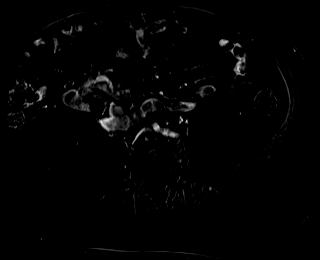
[im 40/80]
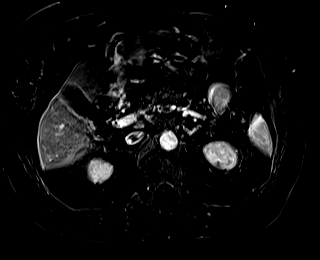
[im 80/80]
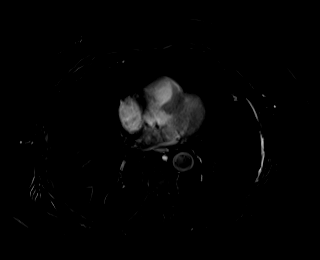

[Series 25: T1 dynamic · axial · 3.0mm · 1.31mm/px · z∈[-202,+35]mm · 3 of 80 slices shown (6 of 10)]
[im 1/80]
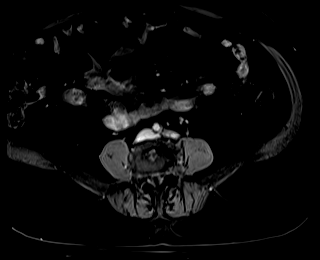
[im 40/80]
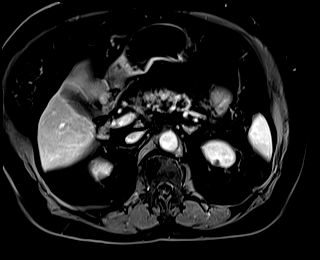
[im 80/80]
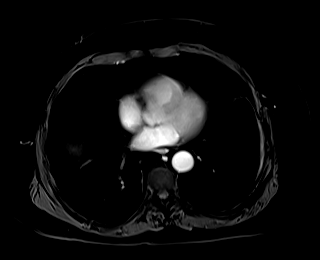

[Series 26: T1 dynamic · axial · 3.0mm · 1.31mm/px · z∈[-202,+35]mm · 3 of 80 slices shown (7 of 10)]
[im 1/80]
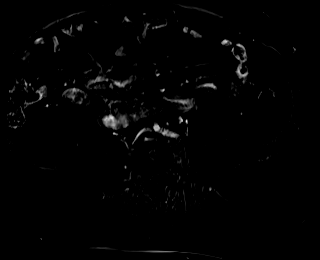
[im 40/80]
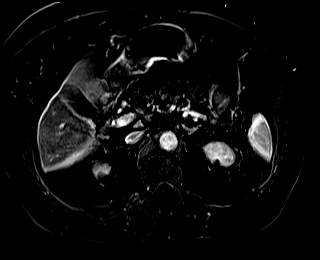
[im 80/80]
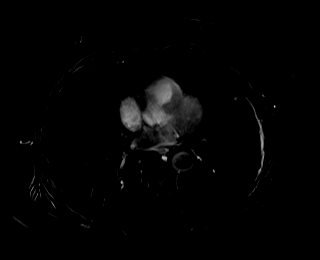

[Series 28: T1 dynamic · coronal · 5.0mm · 1.41mm/px · 2 of 56 slices shown (8 of 10)]
[im 1/56]
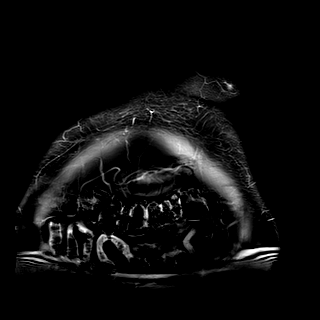
[im 56/56]
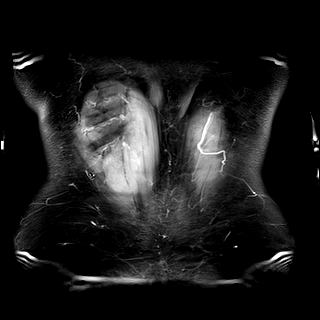

[Series 29: T2 · axial · 6.0mm · 1.56mm/px · 1 of 30 slices shown (2 of 2)]
[im 1/30]
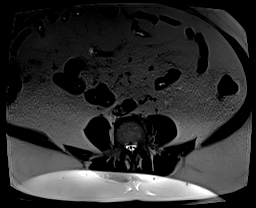

[Series 32: T1 dynamic · axial · 3.0mm · 1.31mm/px · z∈[-202,+35]mm · 3 of 80 slices shown (9 of 10)]
[im 1/80]
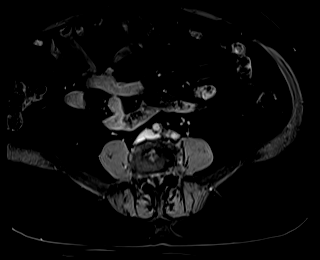
[im 40/80]
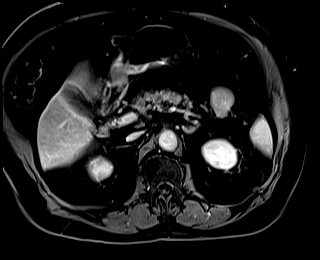
[im 80/80]
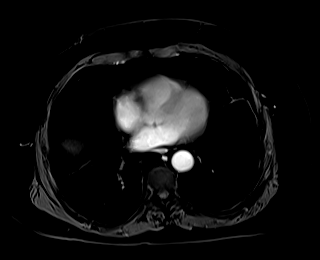

[Series 33: T1 dynamic · axial · 3.0mm · 1.31mm/px · z∈[-202,+35]mm · 3 of 80 slices shown (10 of 10)]
[im 1/80]
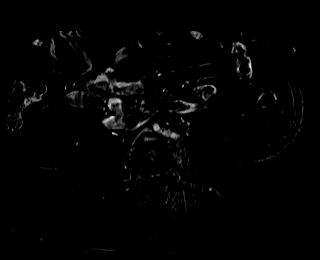
[im 40/80]
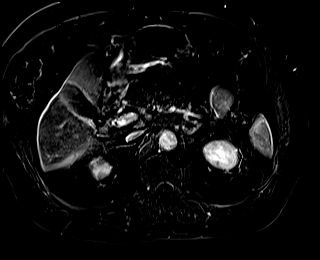
[im 80/80]
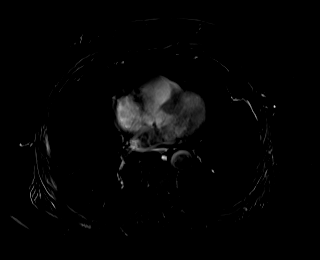

[48 of 48 positions shown; findings below may reference images not displayed]

FINDINGS: Lower chest: Unremarkable.

Hepatobiliary: No suspicious focal abnormality within the liver
parenchyma. There is no evidence for gallstones, gallbladder wall
thickening, or pericholecystic fluid. No intrahepatic or
extrahepatic biliary dilation.

Pancreas: No focal mass lesion. No dilatation of the main duct. No
intraparenchymal cyst. No peripancreatic edema.

Spleen:  No splenomegaly. No focal mass lesion.

3.7 x 3.6 cm well-defined homogeneous soft tissue lesion is
identified along the tail of the pancreas. This seems to closely
mimic signal intensity of spleen on all pulse sequences.

Adrenals/Urinary Tract:  No adrenal nodule or mass.

1.9 cm exophytic well-defined homogeneous lesions identified lower
pole right kidney, as noted on recent ultrasound exam. This has
intermediate signal intensity on precontrast T1 imaging and high
signal intensity on T2 imaging. No evidence for enhancement after IV
contrast administration. Imaging features compatible with benign
complex proteinaceous or hemorrhagic cyst (Bosniak II).

1.7 cm exophytic lesion noted upper pole left kidney, also as
characterized on recent ultrasound and although it was described as
a inferior pole lesion on the ultrasound, review of those images
confirms that the finding was an upper pole lesion. This lesion has
intermediate signal intensity on precontrast T1 imaging, high signal
intensity on T2 imaging and shows no enhancement after IV contrast
administration, also compatible with a benign proteinaceous or
hemorrhagic cyst (Bosniak II). There is also a tiny simple cyst in
the anterior interpolar left kidney.

Stomach/Bowel: Stomach is unremarkable. No gastric wall thickening.
No evidence of outlet obstruction. Duodenum does not cross the
midline compatible with intestinal malrotation. No small bowel or
colonic dilatation within the visualized abdomen.

Vascular/Lymphatic: Abdominal aorta measures maximally at 2.7 cm
diameter by MRI today. No abdominal lymphadenopathy.

Other:  No intraperitoneal free fluid.

Musculoskeletal: No focal suspicious marrow enhancement within the
visualized bony anatomy.
IMPRESSION: 1. Lesions of concern in the lower pole right kidney and upper pole
left kidney on recent ultrasound represent benign hemorrhagic or
proteinaceous cyst (Bosniak II).
2. 3.7 x 3.6 cm well-defined homogeneous soft tissue lesion along
the tail of the pancreas. This seems to mimic signal intensity of
spleen on all pulse sequences and is most likely a prominent
splenule. If the patient has prior abdominal CT or MRI, this would
be helpful to ensure chronicity. In the absence of prior imaging,
follow-up CT or MRI in 3 months is recommended to ensure stability.

## 2021-03-28 MED ORDER — GADOBUTROL 1 MMOL/ML IV SOLN
10.0000 mL | Freq: Once | INTRAVENOUS | Status: AC | PRN
  Administered 2021-03-28: 10 mL via INTRAVENOUS

## 2021-03-29 ENCOUNTER — Other Ambulatory Visit: Payer: Self-pay

## 2021-03-29 DIAGNOSIS — R935 Abnormal findings on diagnostic imaging of other abdominal regions, including retroperitoneum: Secondary | ICD-10-CM

## 2021-03-29 DIAGNOSIS — K869 Disease of pancreas, unspecified: Secondary | ICD-10-CM

## 2021-03-29 DIAGNOSIS — D509 Iron deficiency anemia, unspecified: Secondary | ICD-10-CM

## 2021-03-29 DIAGNOSIS — R140 Abdominal distension (gaseous): Secondary | ICD-10-CM

## 2021-03-29 NOTE — Progress Notes (Signed)
Patient needs this scheduled at 65-17

## 2021-05-23 ENCOUNTER — Ambulatory Visit: Payer: Medicare HMO | Admitting: Cardiology

## 2021-05-24 ENCOUNTER — Encounter: Payer: Self-pay | Admitting: Cardiology

## 2021-07-06 ENCOUNTER — Ambulatory Visit (HOSPITAL_BASED_OUTPATIENT_CLINIC_OR_DEPARTMENT_OTHER): Payer: Medicare HMO

## 2021-07-13 ENCOUNTER — Ambulatory Visit (HOSPITAL_BASED_OUTPATIENT_CLINIC_OR_DEPARTMENT_OTHER)
Admission: RE | Admit: 2021-07-13 | Discharge: 2021-07-13 | Disposition: A | Discharge status: Home / Self Care | Payer: Medicare HMO | Source: Ambulatory Visit | Attending: Gastroenterology | Admitting: Gastroenterology

## 2021-07-13 ENCOUNTER — Other Ambulatory Visit: Payer: Self-pay

## 2021-07-13 DIAGNOSIS — R140 Abdominal distension (gaseous): Secondary | ICD-10-CM | POA: Diagnosis present

## 2021-07-13 DIAGNOSIS — K869 Disease of pancreas, unspecified: Secondary | ICD-10-CM | POA: Diagnosis present

## 2021-07-13 DIAGNOSIS — D509 Iron deficiency anemia, unspecified: Secondary | ICD-10-CM

## 2021-07-13 DIAGNOSIS — R935 Abnormal findings on diagnostic imaging of other abdominal regions, including retroperitoneum: Secondary | ICD-10-CM | POA: Insufficient documentation

## 2021-07-13 IMAGING — MR MR ABDOMEN WO/W CM MRCP
12 of 19 series · 25 of 48 positions shown · IV contrast (gadavist)
Comparison: [DATE]

CLINICAL DATA: Follow-up left abdominal soft tissue mass.

EXAM:
MRI ABDOMEN WITHOUT AND WITH CONTRAST (INCLUDING MRCP)
TECHNIQUE: Multiplanar multisequence MR imaging of the abdomen was performed
both before and after the administration of intravenous contrast.
Heavily T2-weighted images of the biliary and pancreatic ducts were
obtained, and three-dimensional MRCP images were rendered by post
processing.
CONTRAST:  10mL GADAVIST GADOBUTROL 1 MMOL/ML IV SOLN

[Series 3: T2 · coronal · 7.0mm · 1.56mm/px · 1 of 36 slices shown (1 of 2)]
[im 1/36]
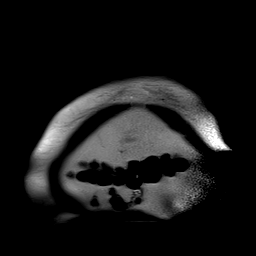

[Series 4: T2 fat-sat · axial · 6.0mm · 1.56mm/px · 1 of 36 slices shown]
[im 1/36]
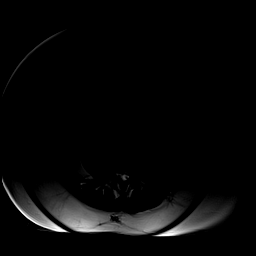

[Series 5: in + out · axial · 6.0mm · 0.78mm/px · z∈[-159,+74]mm · 2 of 64 slices shown]
[im 1/64]
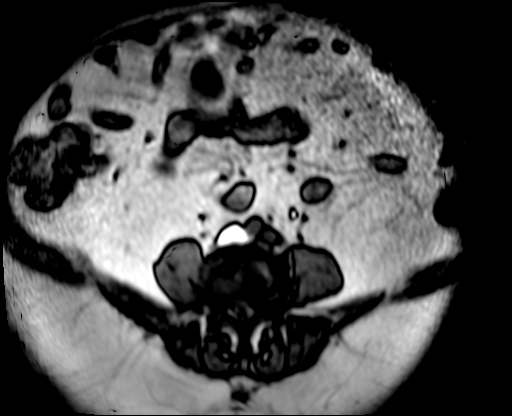
[im 64/64]
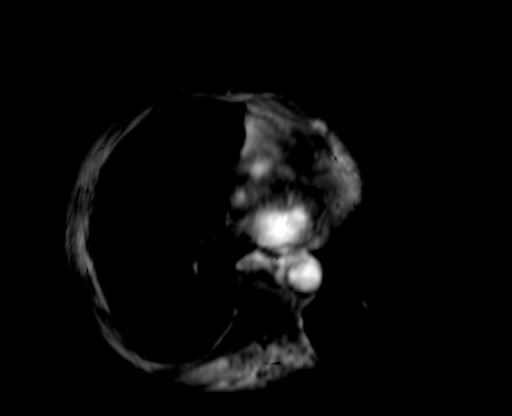

[Series 6: [id number]_800 free breathing · axial · 6.0mm · 2.08mm/px · z∈[-121,+96]mm · 4 of 90 slices shown]
[im 1/90]
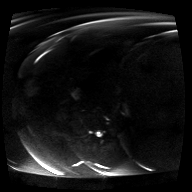
[im 30/90]
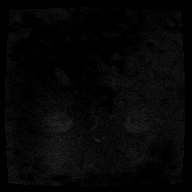
[im 60/90]
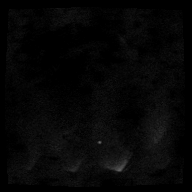
[im 90/90]
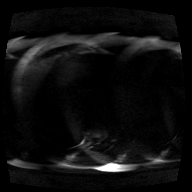

[Series 7: [id number]_800 free breathing_adc · axial · 6.0mm · 2.08mm/px · 1 of 30 slices shown]
[im 1/30]
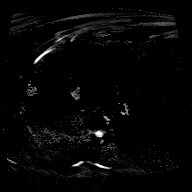

[Series 8: DWI · axial · 6.0mm · 0.78mm/px · z∈[-181,+96]mm · 2 of 38 slices shown]
[im 1/38]
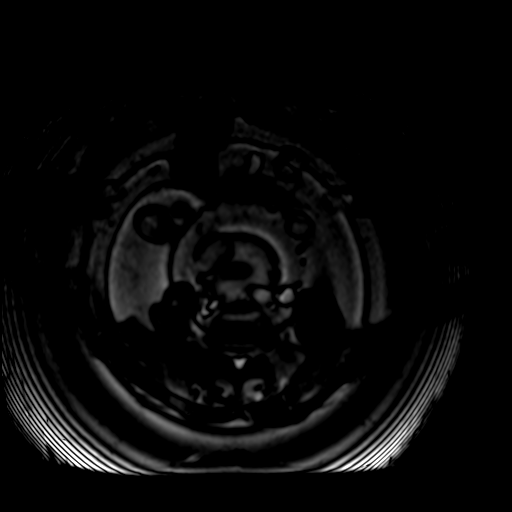
[im 38/38]
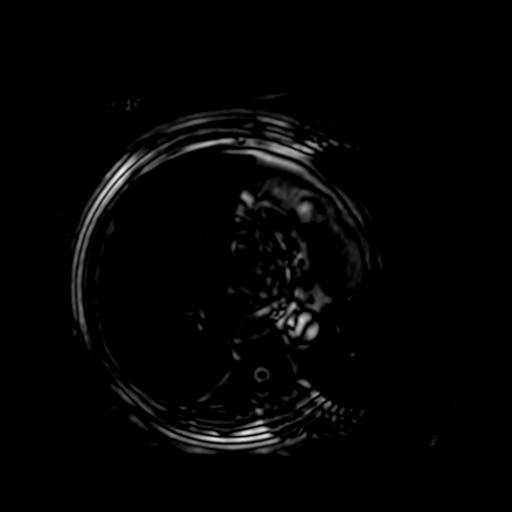

[Series 18: T2 · axial · 6.0mm · 1.56mm/px · 1 of 30 slices shown (2 of 2)]
[im 1/30]
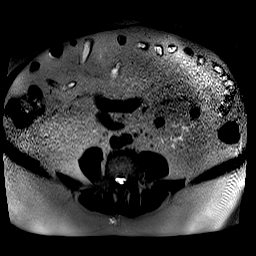

[Series 19: T1 dynamic fat-sat post-contrast · axial · delayed · 4.0mm · 0.78mm/px · z∈[-185,+99]mm · 3 of 72 slices shown]
[im 1/72]
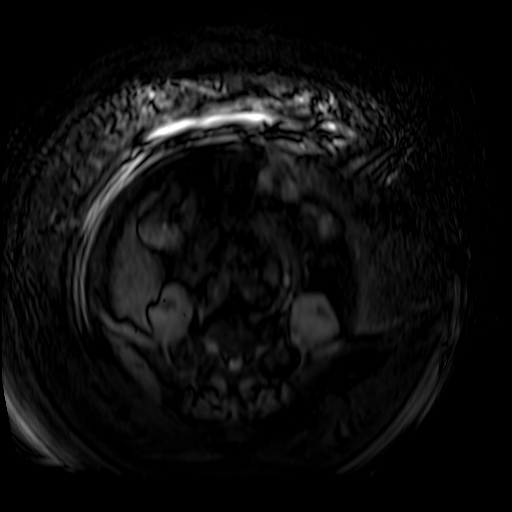
[im 36/72]
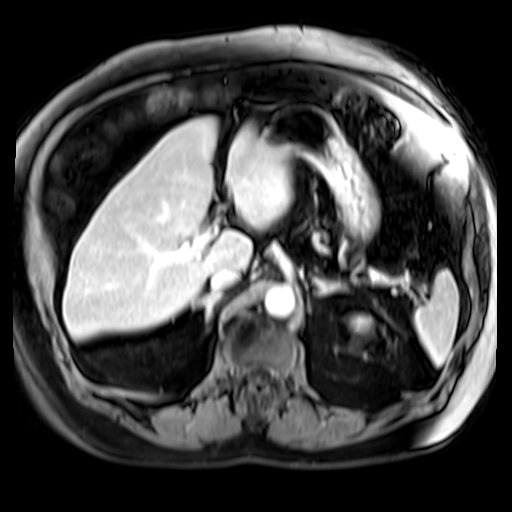
[im 72/72]
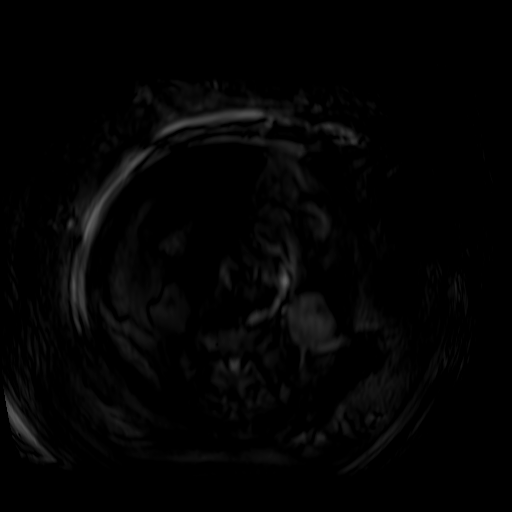

[Series 5003: sub_1 · axial · 4.0mm · 0.78mm/px · z∈[-185,+99]mm · 3 of 72 slices shown]
[im 1/72]
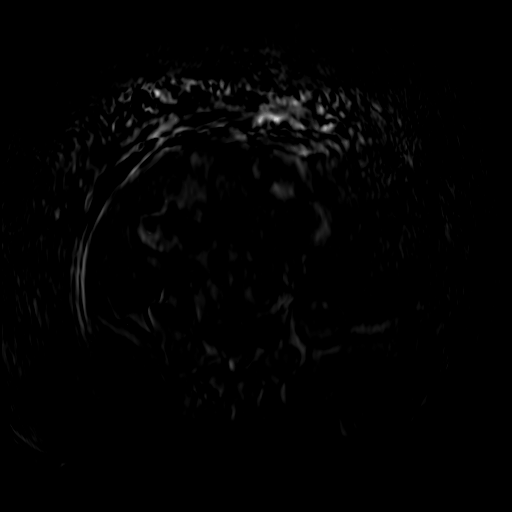
[im 36/72]
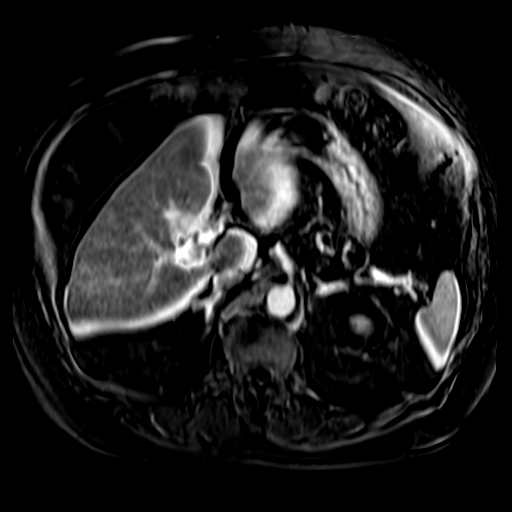
[im 72/72]
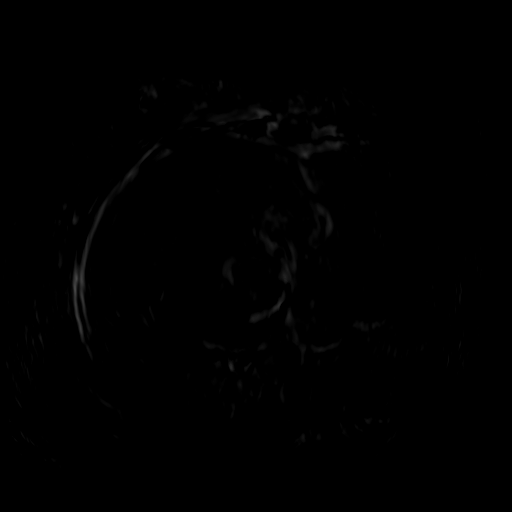

[Series 5004: sub_2 · axial · 4.0mm · 0.78mm/px · z∈[-185,+99]mm · 3 of 72 slices shown]
[im 1/72]
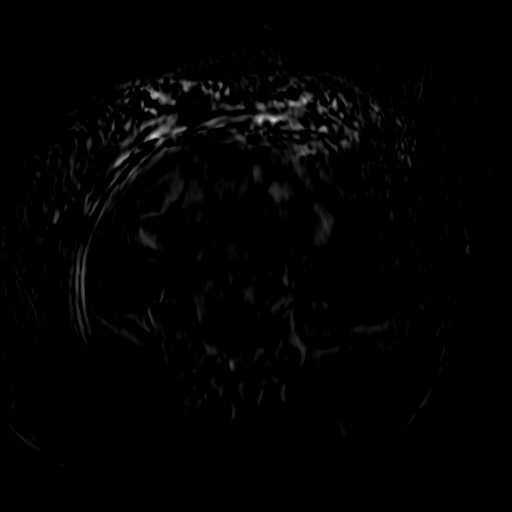
[im 36/72]
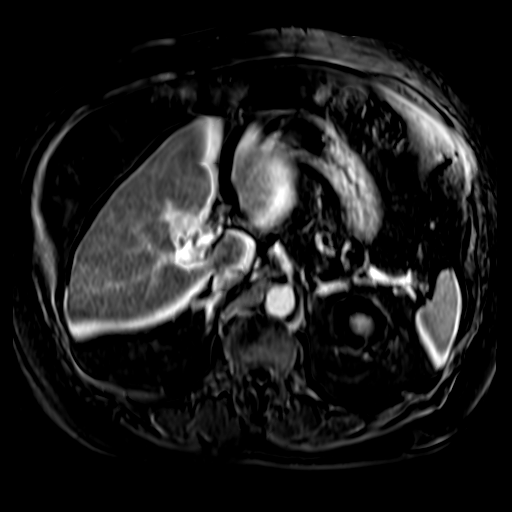
[im 72/72]
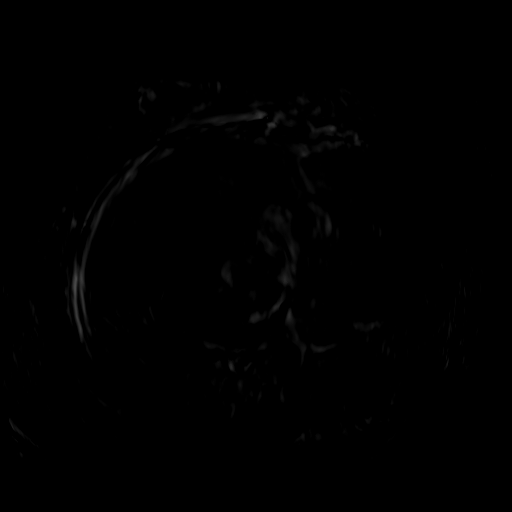

[Series 5005: sub_3 · axial · 4.0mm · 0.78mm/px · z∈[-185,+99]mm · 3 of 72 slices shown]
[im 1/72]
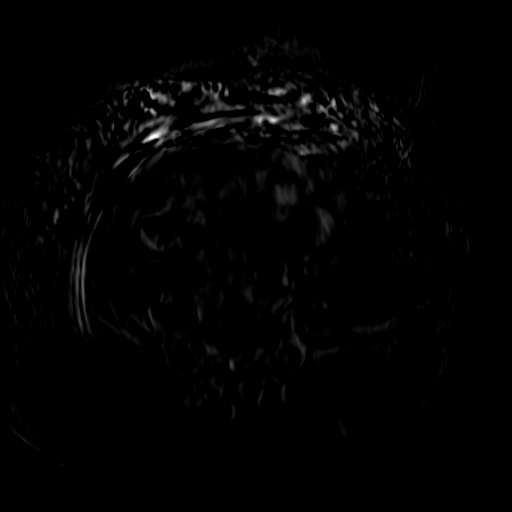
[im 36/72]
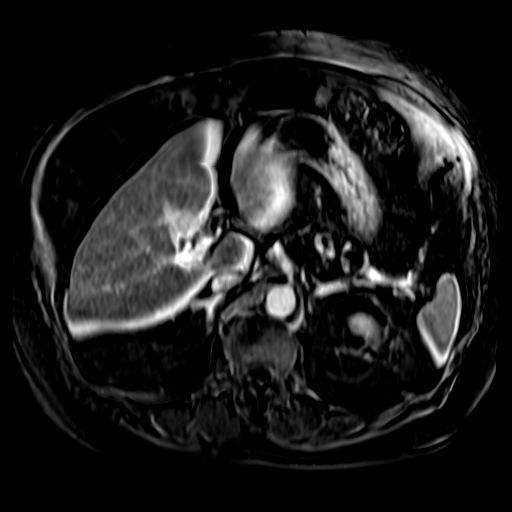
[im 72/72]
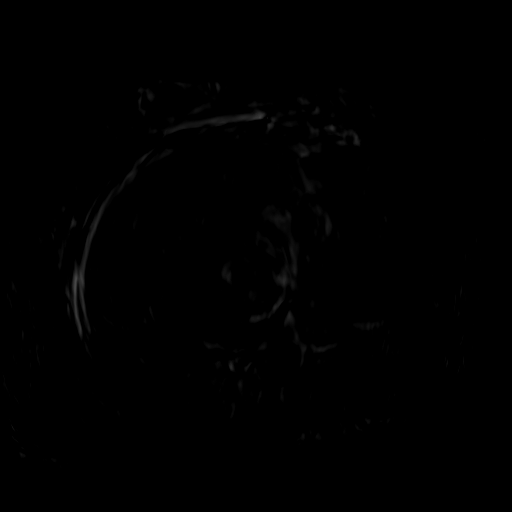

[Series 5006: sub_4 · axial · 4.0mm · 0.78mm/px · 1 of 72 slices shown]
[im 1/72]
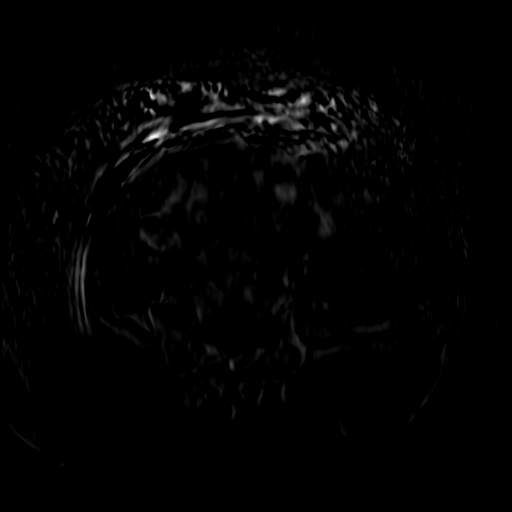

[25 of 48 positions shown; findings below may reference images not displayed]

FINDINGS: Lower chest: No acute findings.

Hepatobiliary: No hepatic masses identified. Gallbladder is
unremarkable. No evidence of biliary ductal dilatation.

Pancreas: No mass or inflammatory changes. No evidence of pancreatic
ductal dilatation.

Spleen:  Within normal limits in size and appearance.

Adrenals/Urinary Tract: Stable small benign bilateral renal cysts.
No renal masses identified. No evidence of hydronephrosis.

Stomach/Bowel: Visualized portion unremarkable.

Vascular/Lymphatic: No pathologically enlarged lymph nodes
identified. No acute vascular findings.

Other: A 4.1 x 3.3 cm solid mass is seen between the pancreatic tail
and spleen which is stable since previous study, and shows signal
intensity and contrast enhancement identical to the spleen. This is
consistent with an accessory splenule.

Musculoskeletal:  No suspicious bone lesions identified.
IMPRESSION: Stable 4 cm mass between the pancreatic tail and spleen, consistent
with a benign accessory splenule.

Stable small benign bilateral renal cysts.

## 2021-07-13 MED ORDER — GADOBUTROL 1 MMOL/ML IV SOLN
10.0000 mL | Freq: Once | INTRAVENOUS | Status: AC | PRN
  Administered 2021-07-13: 10 mL via INTRAVENOUS

## 2024-06-06 ENCOUNTER — Ambulatory Visit (HOSPITAL_BASED_OUTPATIENT_CLINIC_OR_DEPARTMENT_OTHER): Admission: EM | Admit: 2024-06-06 | Discharge: 2024-06-06 | Disposition: A | Discharge status: Home / Self Care

## 2024-06-06 ENCOUNTER — Encounter (HOSPITAL_BASED_OUTPATIENT_CLINIC_OR_DEPARTMENT_OTHER): Payer: Self-pay

## 2024-06-06 ENCOUNTER — Ambulatory Visit (INDEPENDENT_AMBULATORY_CARE_PROVIDER_SITE_OTHER): Admit: 2024-06-06 | Discharge: 2024-06-06 | Disposition: A | Discharge status: Home / Self Care | Admitting: Radiology

## 2024-06-06 DIAGNOSIS — G8929 Other chronic pain: Secondary | ICD-10-CM | POA: Diagnosis not present

## 2024-06-06 DIAGNOSIS — R1031 Right lower quadrant pain: Secondary | ICD-10-CM | POA: Diagnosis not present

## 2024-06-06 DIAGNOSIS — R102 Pelvic and perineal pain: Secondary | ICD-10-CM

## 2024-06-06 MED ORDER — TRIAMCINOLONE ACETONIDE 40 MG/ML IJ SUSP
40.0000 mg | Freq: Once | INTRAMUSCULAR | Status: AC
  Administered 2024-06-06: 40 mg via INTRAMUSCULAR

## 2024-06-06 NOTE — ED Triage Notes (Signed)
 The pain started Saturday afternoon about 2pm without injury . Pain is located near groin at the top of his leg. States it feels like sharp pain with weight bearing. Patient last took ibuprofen yesterday with no relief.

## 2024-06-06 NOTE — ED Provider Notes (Signed)
 [facility]    CSN: [id number] Arrival date & time: 06/06/24  1206      History   Chief Complaint Chief Complaint  Patient presents with   Leg Pain    HPI Patrick Matthews is a 73 y.o. male.   Pt is a 73 year old male that presents with right groin pain. The pain started Saturday afternoon about 2pm without injury . Pain is located near groin at the top of his leg. States it feels like sharp pain with weight bearing. Patient last took ibuprofen yesterday with no relief. No urinary symptoms, fever.    Leg Pain   Past Medical History:  Diagnosis Date   Anemia    Atherosclerosis of both carotid arteries    Cardiomyopathy (HCC)    COPD (chronic obstructive pulmonary disease) (HCC)    Hemorrhagic stroke (HCC)    History of cerebrovascular accident (CVA) due to embolism    History of cystic kidney disease    Hypertension    Malaise and fatigue    Nephrolithiasis    Sleep apnea    Stage 3a chronic kidney disease (HCC)    Stenosis of both vertebral arteries    Vitamin B12 deficiency     Patient Active Problem List   Diagnosis Date Noted   Vitamin B12 deficiency    Stenosis of both vertebral arteries    Stage 3a chronic kidney disease (HCC)    Sleep apnea    Malaise and fatigue    Nephrolithiasis    Hypertension    History of cystic kidney disease    History of cerebrovascular accident (CVA) due to embolism    Hemorrhagic stroke (HCC)    COPD (chronic obstructive pulmonary disease) (HCC)    Cardiomyopathy (HCC)    Atherosclerosis of both carotid arteries    Anemia     History reviewed. No pertinent surgical history.     Home Medications    Prior to Admission medications   Medication Sig Start Date End Date Taking? Authorizing Provider  tamsulosin (FLOMAX) 0.4 MG CAPS capsule Take 0.4 mg by mouth daily. 05/16/24  Yes [provider]  albuterol  (VENTOLIN  HFA) 108 (90 Base) MCG/ACT inhaler Inhale 2 puffs into the lungs every 4 (four) hours as  needed for wheezing or shortness of breath. 12/10/20   [provider]  amLODipine (NORVASC) 5 MG tablet Take 5 mg by mouth daily. 09/26/20   [provider]  aspirin EC 81 MG tablet Take 81 mg by mouth daily. Swallow whole.    [provider]  atorvastatin (LIPITOR) 80 MG tablet Take 1 tablet by mouth daily. 12/10/20   [provider]  budesonide-formoterol (SYMBICORT) 160-4.5 MCG/ACT inhaler Inhale into the lungs daily at 6 (six) AM. 12/10/20   [provider]  clopidogrel (PLAVIX) 75 MG tablet 75 mg. 09/23/20   [provider]  cyanocobalamin 1000 MCG tablet Take 1,000 mcg by mouth daily.    [provider]  losartan (COZAAR) 50 MG tablet Take 50 mg by mouth daily.    [provider]  losartan-hydrochlorothiazide (HYZAAR) 100-25 MG tablet Take 1 tablet by mouth daily. 12/10/20   [provider]    Family History Family History  Problem Relation Age of Onset   Cancer Mother    Heart attack Father     Social History Social History   Tobacco Use   Smoking status: Former    Current packs/day: 1.00    Types: Cigarettes   Smokeless tobacco:  Never  Vaping Use   Vaping status: Never Used  Substance Use Topics   Alcohol  use: Not Currently   Drug use: Not Currently     Allergies   Patient has no known allergies.   Review of Systems Review of Systems  See HPI Physical Exam Triage Vital Signs ED Triage Vitals  Encounter Vitals Group     BP 06/06/24 1233 (!) 165/96     Girls Systolic BP Percentile --      Girls Diastolic BP Percentile --      Boys Systolic BP Percentile --      Boys Diastolic BP Percentile --      Pulse Rate 06/06/24 1233 63     Resp 06/06/24 1233 20     Temp 06/06/24 1233 97.8 F (36.6 C)     Temp src --      SpO2 06/06/24 1233 96 %     Weight --      Height --      Head Circumference --      Peak Flow --      Pain Score 06/06/24 1238 10     Pain Loc --      Pain  Education --      Exclude from Growth Chart --    No data found.  Updated Vital Signs BP (!) 165/96 (BP Location: Left Arm)   Pulse 63   Temp 97.8 F (36.6 C)   Resp 20   SpO2 96%   Visual Acuity Right Eye Distance:   Left Eye Distance:   Bilateral Distance:    Right Eye Near:   Left Eye Near:    Bilateral Near:     Physical Exam Constitutional:      Appearance: Normal appearance.  Pulmonary:     Effort: Pulmonary effort is normal.  Musculoskeletal:        General: Normal range of motion.       Legs:  Neurological:     Mental Status: He is alert.  Psychiatric:        Mood and Affect: Mood normal.      UC Treatments / Results  Labs (all labs ordered are listed, but only abnormal results are displayed) Labs Reviewed - No data to display  EKG   Radiology DG Hip Unilat With Pelvis 2-3 Views Right Result Date: 06/06/2024 CLINICAL DATA:  390131 Pelvic pain 390131. EXAM: DG HIP (WITH OR WITHOUT PELVIS) 2-3V RIGHT COMPARISON:  None Available. FINDINGS: Pelvis is intact with normal and symmetric sacroiliac joints. No acute fracture or dislocation. No aggressive osseous lesion. Visualized sacral arcuate lines are unremarkable. There are changes of chronic pubic symphisitis. There are moderate degenerative changes of bilateral hip joints characterized by joint space narrowing and osteophytosis of the superior acetabulum. No radiopaque foreign bodies. IMPRESSION: No acute osseous abnormality of the pelvis or right hip joint. Moderate degenerative changes of bilateral hip joints. Electronically Signed   By: Ree Molt M.D.   On: 06/06/2024 13:28    Procedures Procedures (including critical care time)  Medications Ordered in UC Medications  triamcinolone  acetonide (KENALOG -40) injection 40 mg (40 mg Intramuscular Given 06/06/24 1353)    Initial Impression / Assessment and Plan / UC Course  I have reviewed the triage vital signs and the nursing notes.  Pertinent  labs & imaging results that were available during my care of the patient were reviewed by me and considered in my medical decision making (see chart for details).  Groin pain- xray done here today showed moderate arthritis in both hips. I believe this could be the cause of his pain. He may also have some nerve involvement/impingement. No red flags. Will treat with kenalog  injection here in clinic today. Recommend tylenol extra strength every 8-12 hours as needed.  Can Ice the area.  If pain doesn't improve will need to see orthopedic specialist.   Final Clinical Impressions(s) / UC Diagnoses   Final diagnoses:  Groin pain, chronic, right     Discharge Instructions      Your x-ray showed moderate arthritis in both hips.  This could be a cause of groin pain.  It may also have some nerve inflammation. I am giving you a steroid injection here in clinic today.  I would recommend if this continues to follow-up with orthopedic.  You  can also take extra strength Tylenol for pain as needed.    ED Prescriptions   None    PDMP not reviewed this encounter.   Adah Wilbert LABOR, FNP 06/06/24 1430

## 2024-06-06 NOTE — Discharge Instructions (Signed)
 Your x-ray showed moderate arthritis in both hips.  This could be a cause of groin pain.  It may also have some nerve inflammation. I am giving you a steroid injection here in clinic today.  I would recommend if this continues to follow-up with orthopedic.  You  can also take extra strength Tylenol for pain as needed.
